# Patient Record
Sex: Female | Born: 1985 | Race: White | Hispanic: No | Marital: Single | Smoking: Current every day smoker
Health system: Southern US, Community
[De-identification: ages and names within clinical notes are randomized; demographics above are authoritative.]

## PROBLEM LIST (undated history)

## (undated) ENCOUNTER — Inpatient Hospital Stay (HOSPITAL_COMMUNITY): Payer: Self-pay

## (undated) DIAGNOSIS — O24419 Gestational diabetes mellitus in pregnancy, unspecified control: Secondary | ICD-10-CM

## (undated) DIAGNOSIS — F329 Major depressive disorder, single episode, unspecified: Secondary | ICD-10-CM

## (undated) DIAGNOSIS — J45909 Unspecified asthma, uncomplicated: Secondary | ICD-10-CM

## (undated) DIAGNOSIS — F32A Depression, unspecified: Secondary | ICD-10-CM

## (undated) DIAGNOSIS — K8020 Calculus of gallbladder without cholecystitis without obstruction: Secondary | ICD-10-CM

## (undated) HISTORY — PX: COMBINED HYSTEROSCOPY DIAGNOSTIC / D&C: SUR297

---
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
## 2006-01-06 ENCOUNTER — Ambulatory Visit: Payer: Self-pay | Admitting: Family Medicine

## 2006-01-09 ENCOUNTER — Ambulatory Visit: Payer: Self-pay | Admitting: Family Medicine

## 2006-06-18 ENCOUNTER — Ambulatory Visit: Payer: Self-pay | Admitting: Family Medicine

## 2006-09-02 ENCOUNTER — Ambulatory Visit: Payer: Self-pay | Admitting: Family Medicine

## 2006-10-22 ENCOUNTER — Ambulatory Visit: Payer: Self-pay | Admitting: Family Medicine

## 2007-01-22 ENCOUNTER — Ambulatory Visit: Payer: Self-pay | Admitting: Family Medicine

## 2007-04-23 IMAGING — NM NM HEPATO W/GB/PHARM/[PERSON_NAME]
2 series · 12 of 12 positions shown · non-contrast
Comparison: none

CLINICAL DATA: Nausea, vomiting.  No gallstones from a CT of 03/05/05.  
 NUCLEAR MEDICINE HEPATOBILIARY SCAN WITH EJECTION FRACTION:
TECHNIQUE: Sequential abdominal images were obtained following intravenous injection of radiopharmaceutical.  Sequential images were continued following oral ingestion of 8 oz. half-and-half, and the gallbladder ejection fraction was calculated.
 Radiopharmaceutical:  5 mCi Tc-IIm Choletec.

[Series 1: hepatobiliary · 3.20mm/px · 6 of 60 frames shown (1 of 2)]
[frame 6/60]
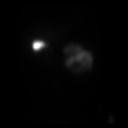
[frame 16/60]
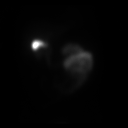
[frame 26/60]
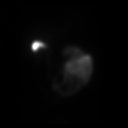
[frame 36/60]
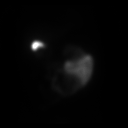
[frame 46/60]
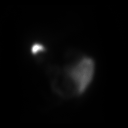
[frame 56/60]
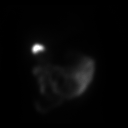

[Series 1: hepatobiliary · 3.20mm/px · 6 of 60 frames shown (2 of 2)]
[frame 6/60]
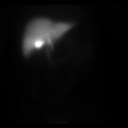
[frame 16/60]
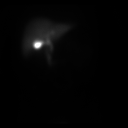
[frame 26/60]
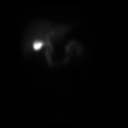
[frame 36/60]
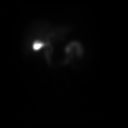
[frame 46/60]
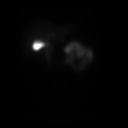
[frame 56/60]
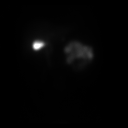

[12 of 12 positions shown; findings below may reference images not displayed]

FINDINGS: There was seen prompt excretion of the Isotope by the normal sized and contoured liver.  The gallbladder was seen earlier in the exam, as were the common bile duct and the bowel.  There was no evidence of obstruction of common bile duct or the cystic duct.
 The patient was then given 8 ounces of Half-n-Half dairy creamer and the ejection fraction was seen to be 33.9% which is slightly below the normal range of 35% at one hour.
IMPRESSION: Hepatobiliary scan is within normal limits.  There was an ejection fraction of 33.9% which is slightly lower than the usual 35% or greater at one hour.

## 2009-09-12 ENCOUNTER — Encounter: Admission: RE | Admit: 2009-09-12 | Discharge: 2009-09-12 | Payer: Self-pay | Admitting: Obstetrics and Gynecology

## 2009-10-20 ENCOUNTER — Ambulatory Visit: Payer: Self-pay | Admitting: Physician Assistant

## 2009-10-20 ENCOUNTER — Inpatient Hospital Stay (HOSPITAL_COMMUNITY): Admission: AD | Admit: 2009-10-20 | Discharge: 2009-10-20 | Payer: Self-pay | Admitting: Obstetrics and Gynecology

## 2009-11-15 ENCOUNTER — Inpatient Hospital Stay (HOSPITAL_COMMUNITY)
Admission: AD | Admit: 2009-11-15 | Discharge: 2009-11-19 | Payer: Self-pay | Source: Ambulatory Visit | Admitting: Obstetrics and Gynecology

## 2010-11-19 LAB — GLUCOSE, CAPILLARY
Glucose-Capillary: 61 mg/dL — ABNORMAL LOW (ref 70–99)
Glucose-Capillary: 66 mg/dL — ABNORMAL LOW (ref 70–99)
Glucose-Capillary: 73 mg/dL (ref 70–99)
Glucose-Capillary: 73 mg/dL (ref 70–99)
Glucose-Capillary: 74 mg/dL (ref 70–99)
Glucose-Capillary: 79 mg/dL (ref 70–99)
Glucose-Capillary: 79 mg/dL (ref 70–99)
Glucose-Capillary: 79 mg/dL (ref 70–99)
Glucose-Capillary: 82 mg/dL (ref 70–99)
Glucose-Capillary: 84 mg/dL (ref 70–99)
Glucose-Capillary: 87 mg/dL (ref 70–99)
Glucose-Capillary: 88 mg/dL (ref 70–99)
Glucose-Capillary: 90 mg/dL (ref 70–99)
Glucose-Capillary: 94 mg/dL (ref 70–99)

## 2010-11-19 LAB — CBC
HCT: 27.7 % — ABNORMAL LOW (ref 36.0–46.0)
HCT: 31.3 % — ABNORMAL LOW (ref 36.0–46.0)
HCT: 37.8 % (ref 36.0–46.0)
Hemoglobin: 10.7 g/dL — ABNORMAL LOW (ref 12.0–15.0)
Platelets: 305 10*3/uL (ref 150–400)
RDW: 16.1 % — ABNORMAL HIGH (ref 11.5–15.5)
WBC: 19.8 10*3/uL — ABNORMAL HIGH (ref 4.0–10.5)
WBC: 30 10*3/uL — ABNORMAL HIGH (ref 4.0–10.5)

## 2010-12-08 ENCOUNTER — Inpatient Hospital Stay (HOSPITAL_COMMUNITY)
Admission: AD | Admit: 2010-12-08 | Discharge: 2010-12-09 | Disposition: A | Discharge status: Home / Self Care | Payer: BC Managed Care – PPO | Source: Ambulatory Visit | Attending: Obstetrics and Gynecology | Admitting: Obstetrics and Gynecology

## 2010-12-08 DIAGNOSIS — O212 Late vomiting of pregnancy: Secondary | ICD-10-CM

## 2010-12-08 LAB — URINE MICROSCOPIC-ADD ON

## 2010-12-08 LAB — URINALYSIS, ROUTINE W REFLEX MICROSCOPIC
Glucose, UA: NEGATIVE mg/dL
Hgb urine dipstick: NEGATIVE
Ketones, ur: 40 mg/dL — AB
Nitrite: NEGATIVE
Urobilinogen, UA: 1 mg/dL (ref 0.0–1.0)
pH: 7.5 (ref 5.0–8.0)

## 2010-12-12 ENCOUNTER — Inpatient Hospital Stay (HOSPITAL_COMMUNITY)
Admission: AD | Admit: 2010-12-12 | Discharge: 2010-12-13 | Disposition: A | Discharge status: Home / Self Care | Payer: BC Managed Care – PPO | Source: Ambulatory Visit | Attending: Obstetrics and Gynecology | Admitting: Obstetrics and Gynecology

## 2010-12-12 DIAGNOSIS — R109 Unspecified abdominal pain: Secondary | ICD-10-CM

## 2010-12-12 DIAGNOSIS — O4700 False labor before 37 completed weeks of gestation, unspecified trimester: Secondary | ICD-10-CM

## 2010-12-31 ENCOUNTER — Other Ambulatory Visit: Payer: Self-pay | Admitting: Obstetrics and Gynecology

## 2010-12-31 ENCOUNTER — Encounter (HOSPITAL_COMMUNITY): Payer: BC Managed Care – PPO

## 2010-12-31 LAB — SURGICAL PCR SCREEN: MRSA, PCR: NEGATIVE

## 2010-12-31 LAB — CBC
HCT: 38.7 % (ref 36.0–46.0)
Hemoglobin: 12.7 g/dL (ref 12.0–15.0)
MCV: 86.4 fL (ref 78.0–100.0)

## 2011-01-07 ENCOUNTER — Inpatient Hospital Stay (HOSPITAL_COMMUNITY)
Admission: RE | Admit: 2011-01-07 | Discharge: 2011-01-10 | DRG: 371 | Disposition: A | Discharge status: Home / Self Care | Payer: BC Managed Care – PPO | Source: Ambulatory Visit | Attending: Obstetrics and Gynecology | Admitting: Obstetrics and Gynecology

## 2011-01-07 DIAGNOSIS — O34219 Maternal care for unspecified type scar from previous cesarean delivery: Principal | ICD-10-CM | POA: Diagnosis present

## 2011-01-07 DIAGNOSIS — Z01812 Encounter for preprocedural laboratory examination: Secondary | ICD-10-CM

## 2011-01-07 DIAGNOSIS — O99892 Other specified diseases and conditions complicating childbirth: Secondary | ICD-10-CM | POA: Diagnosis present

## 2011-01-07 DIAGNOSIS — Z22330 Carrier of Group B streptococcus: Secondary | ICD-10-CM

## 2011-01-07 DIAGNOSIS — Z01818 Encounter for other preprocedural examination: Secondary | ICD-10-CM

## 2011-01-08 LAB — CBC
Hemoglobin: 11.3 g/dL — ABNORMAL LOW (ref 12.0–15.0)
MCH: 27.5 pg (ref 26.0–34.0)
RBC: 4.11 MIL/uL (ref 3.87–5.11)
WBC: 19.5 10*3/uL — ABNORMAL HIGH (ref 4.0–10.5)

## 2011-01-11 NOTE — Discharge Summary (Signed)
NAMEMarland Kitchen  Kari Pearson, Kari Pearson                            ACCOUNT NO.:  [id number]   MEDICAL RECORD NO.:  [id number]                   PATIENT TYPE:  INP   LOCATION:  A315                                 FACILITY:  [facility]   PHYSICIAN:  Lionel December, M.D.                 DATE OF BIRTH:  [birth year]   DATE OF ADMISSION:  05/21/2002  DATE OF DISCHARGE:  05/27/2002                                 DISCHARGE SUMMARY   DISCHARGE DIAGNOSES:  1. 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  2. Mildly elevated transaminases, nonspecific.  Resolved.   CONDITION AT TIME OF DISCHARGE:  Improved.   DISCHARGE MEDICATIONS:  1. Prednisone 40 mg q.a.m. dose reduction as written on discharge sheet.  2. Metronidazole 250 mg t.i.d. for 4 days.  3. Protonix 40 mg p.o. q.a.m.  4. Dilaudid 4 mg p.o. before each meal and at bedtime.  5. Gastrocrom 100 mg t.i.d.  6. Phenergan 12.5 to 25 mg p.o. t.i.d. p.r.n.   FOLLOW UP:  Follow up on June 03, 2002.  The patient will have CBC, AST,  ALT prior to office visit.   HOSPITAL COURSE:  The patient is a 25 year old Caucasian female who  generally has been in good health.  Present illness began 4 days prior to  admission.  She developed upper and midabdominal pain associated with nausea  and vomiting which did not respond to outpatient therapy.  She had been seen  in the emergency room 2 days prior to admission.  Her CBC, MET-7, LFTs,  amylase and lipase were all normal.  Pelvic ultrasound was also within  normal limits.  At that time she had abdominopelvic CT which showed mild  abdominal adenopathy limited to mesenteric lymph nodes.  She had small bowel  thickening which was mainly in the jejunum and duodenum and mild  splenomegaly as well as a very small left ovarian cyst.  The patient was  scheduled to be seen on an outpatient basis but developed intractable pain  which was not responding to Vicodin that she was taking every 4 hours and  she also had persistent  nausea and vomiting.  On admission she weighed 145.5  pounds, she is 5 feet 1 inch tall, she was afebrile.  She was in some pain.  She was begun on IV fluids, IV Pepcid and given Nubain for pain control.  Her lab studies were repeated.  Her WBC was 11.6, H&H were 14 and 41.2,  platelet count was 387K.  She had 8% eosinophils.  Her AST was 49 which was  mildly elevated and ALT was 33, the rest of the LFT panel was normal.  LDH  was normal at 152, sed rate was also normal at 5.  With this picture, I felt  that she had infectious mononucleosis however, Monospot was negative.  She  continued to experience severe pain and nausea, vomiting, and analgesia had  to  be changed.  She was able to undergo upper GI with small-bowel follow-  through the following day.  There was thickening to mucosa of duodenum and  jejunum.  There was no evidence of peptic ulcer disease or changes of  ileitis.  LFTs were repeated, her AST was 47 but ALT almost doubled to 64.  Giardia stool antigen was requested along with celiac antibody panel.  She  underwent esophagogastroduodenoscopy by me on May 18, 2002.  Her  esophagus was normal, she had anterolateral __________ edema and biopsy was  taken.  She had marked edema and irregularity to mucosal folds of second,  third, and possibly the fourth part of the duodenum however, there was no  ulceration or erosions.  Multiple biopsies were taken.  Her CBC was repeated  and her eos count was now 10%.  She was empirically treated with  metronidazole but did not feel any better.  Since her transaminases were  elevated I rechecked Monospot and it was still negative.  Biopsy from the  stomach and duodenum showed changes of eosinophilic duodenitis which were  more pronounced in the small bowel but also seen on the gastric biopsies.  Ultrasound of upper abdomen was obtained and was normal.  She had sludge in  her gallbladder but that was felt to be nonspecific and not unusual  in a  patient who been fasting and not able to eat.  With this information, I felt  that she had eosinophilic enteritis, possibly idiopathic.  She never  experienced diarrhea during her hospitalization.  Her celiac antibody panel  was negative.  Felt that this could also be food allergy which would be  difficult to pinpoint.  She was begun on the [facility] Xxx which was not  immediately available.  Since she was suffering a great deal with abdominal  pain I elected to treat her with IV steroids.  Her eos count had jumped to  15% when the therapy was initiated.  After a few doses her eos count was 0  and then parallel to that she noted improvement in her upper abdominal pain  and was able to keep her food down.  Her last CBC revealed WBC of 18.5 but I  felt that this leukocytosis was a result of steroids.  By the afternoon of  May 27, 2002, she was feeling greatly better and I felt she was ready to  be discharged.  She was given suppository for constipation.  Early on  nursing staff thought that they noted irregular rhythm therefore, EKG was  obtained which showed sinus arrhythmia otherwise normal EKG.   PLAN:  Plan is for her to be seen in the office next week.  We will consider  rapid prednisone taper.  She will have a followup CT within the next 4-8  weeks to make sure that lymphadenopathy is not progressive.                                               Lionel December, M.D.    NR/MEDQ  D:  06/01/2002  T:  06/02/2002  Job:  [id number]   cc:   Delaney Meigs, M.D.  [address]  [place]  [place] [address]  Fax: [phone]

## 2011-01-11 NOTE — H&P (Signed)
NAMEMarland Kitchen  QUINLYN, TEP                            ACCOUNT NO.:  [id number]   MEDICAL RECORD NO.:  [id number]                   PATIENT TYPE:  INP   LOCATION:  A315                                 FACILITY:  [facility]   PHYSICIAN:  Lionel December, M.D.                 DATE OF BIRTH:  03/26/1986   DATE OF ADMISSION:  05/21/2002  DATE OF DISCHARGE:                                HISTORY & PHYSICAL   PRESENTING COMPLAINT:  Intractable abdominal pain of four days duration.  Nausea and vomiting.   HISTORY OF PRESENT ILLNESS:  The patient is a 25 year old Caucasian female  who is admitted for evaluation of abdominal pain, mesenteric adenopathy,  nausea and vomiting.   She was in usual state of health until four days ago when she experienced  pain on the left side of her abdomen.  The pain was mild to begin with but  gradually increased in intensity, described to be sharp, cutting pain, and  she was cold and clammy according to her mother.  She was brought to the  emergency room on May 19, 2002.  She was evaluated by Dr. Margarita Grizzle.  Her CBC, MET-7, LFTs were all normal; amylase and lipase were normal.  Urine hCG was negative.  She had pelvic ultrasound which was within normal  limits.  There was no evidence of free pelvic fluid.  This was followed by  abdominopelvic CT which showed abdominal adenopathy, mainly of mesenteric  lymph nodes.  They were more pronounced in the right lower quadrant  ileocecal region.  The study also showed small bowel thickening, mild  splenomegaly, and there was a small left ovarian cyst.  Dr. Rosalia Hammers discussed  the case with me over the phone.  The patient was doing well and it was  decided to bring her back for small bowel study prior to office visit.  When  she left the emergency room she was very comfortable.  The patient returned  to the radiology department yesterday for small bowel study but she still  had a contrast from prior CT and the study could not be  done.  The patient's  mother called me this morning stating that the patient had been vomiting all  day and the pain was excruciating.  She had been taking Vicodin one q.4h.  and on one occasion she took two but she still did not get relief to the  point she was comfortable.  It was therefore decided to bring her to the  hospital for pain control and further management.  She has lost 5 pounds in  the last three days.  She has not experienced any fever but has had chills.  She denies hematemesis, heartburn, melena, or rectal bleeding.  There is no  history of diarrhea.   REVIEW OF SYSTEMS:  Negative for joint pains or skin rash.  She has had  intermittent  headache but she has had this for years for which she uses  Aleve or ibuprofen on a p.r.n. basis; last dose was four days ago.  There  has been no change in her headache recently.  She denies dyspnea or chest  pain.  She also denies dysuria, hematuria, or vaginal discharge.  Her last  period was about three weeks ago.  She did have a pelvic exam by Dr. Rosalia Hammers  which was normal.  The patient is not sexually active.  Review of the  systems also negative for sore throat or cough. There is no history of tick  bite.  She does come in contact with dogs but there is no documented history  of flea bites, etc.   MEDICATIONS:  She is presently on  1. Vicodin 5/500 q.4h. p.r.n.  2. NuLev t.i.d.   PAST MEDICAL HISTORY:  She has never had any surgeries.  Two years ago she  got sick while she was visiting her grandparents in [place].  She was  evaluated in the emergency room and had a CT and was told to have a ruptured  ovarian cyst.   ALLERGIES:  PENICILLIN which causes skin rash and skin burning.   FAMILY HISTORY:  Noncontributory.  Both parents and two younger brothers are  in good health.   SOCIAL HISTORY:  The patient is in the 10th grade.  She lives with her  grandparents.   PHYSICAL EXAMINATION:  GENERAL:  Pleasant, mildly-obese  Caucasian female who  is in no acute distress.  VITAL SIGNS:  She weighs in at 145.5 pounds, she is 5 feet 1 inch tall.  Pulse 73 per minute, blood pressure 111/77, respirations 20, and temperature  99.  HEENT:  Conjunctivae pink, sclerae nonicteric.  Oropharyngeal mucosa is  normal.  NECK:  Supple.  No lymphadenopathy, no thyromegaly.  LYMPH:  She also does not have axillary or inguinal adenopathy.  CARDIAC:  With regular rhythm, normal S1 and S2.  No murmur or gallop noted.  LUNGS:  Clear to auscultation.  ABDOMEN:  Symmetrical.  Bowel sounds are normal.  Palpation reveals more or  less generalized tenderness that is more pronounced at epigastric area and  right lower quadrant.  There is some guarding in these areas but no rebound.  The spleen cannot be palpated.  Liver edge is soft, below the right costal  margin.  RECTAL:  Deferred.  EXTREMITIES:  She does not have clubbing, peripheral edema, or skin rash.   LABORATORY DATA:  Labs from May 19, 2002:  WBC 8.7, H&H 14.2, and  41.4, platelet count 354, MCV 77.4.  She has 66% neutrophils, 23 lymphs, 4  monos, 6 eos, and 1 baso.  Urinalysis was within normal limits.  Urinary hCG  was negative.  Serum amylase 40, lipase 29, bilirubin 0.5, AP 87, AST 16,  ALT 13, total protein 6.9, and albumin 3.9.  Sodium 137, potassium 4.4,  chloride 104, CO2 28, glucose 107, BUN 4, creatinine 0.6, calcium 9.2.   I have reviewed the patient's films with Dr. Anselmo Pickler.   ASSESSMENT:  The patient is a 25 year old Caucasian female with a four-day  history of abdominal pain which appears to be migratory associated with  nausea, vomiting, and some weight loss who has thickened small bowel,  mesenteric and ileocecal lymphadenopathy, and mild splenomegaly.  She has  not had any skin rash or sore throat.  She does not have peripheral  lymphadenopathy.  She also does not have diarrhea.  I hope we are dealing with a viral illness, which may explain  this picture.  She could also have  an enteric infection, although she has not had any diarrhea.  Infectious  mononucleosis remains a possibility.  I hope we are not dealing with  lymphoproliferative disorder.    PLAN:  Give her IV fluids and IV analgesia.  Will start her on Pepcid 20 mg  IV q.12h.  CBC with peripheral smear will be repeated, along with sed rate,  mono spot,and LFTs.  Will try to obtain upper GI with small bowel follow  through.  I will also continue her Levsin.                                                Lionel December, M.D.    NR/MEDQ  D:  05/21/2002  T:  05/22/2002  Job:  [id number]   cc:   Delaney Meigs, M.D.  [address]  [place]  [place] [address]  Fax: [phone]

## 2011-01-11 NOTE — Op Note (Signed)
NAMEMarland Kitchen  Kari Pearson, Kari Pearson                            ACCOUNT NO.:  [id number]   MEDICAL RECORD NO.:  [id number]                   PATIENT TYPE:  INP   LOCATION:  A315                                 FACILITY:  [facility]   PHYSICIAN:  Lionel December, M.D.                 DATE OF BIRTH:  09-29-1985   DATE OF PROCEDURE:  05/24/2002  DATE OF DISCHARGE:                                 OPERATIVE REPORT   PROCEDURE:  Esophagogastroduodenoscopy with biopsy.   ENDOSCOPIST:  Lionel December, M.D.   INDICATIONS:  This patient is a 25 year old Caucasian female with new onset  of abdominal pain, nausea and vomiting who has thickened duodenum and  jejunum on CT as well as small-bowel studies.  She also had mesenteric and  ileocolic adenopathy and mild splenomegaly.  Her Monospot test is negative.  She has not experienced any diarrhea.  She also has developed eosinophilia.  Stool Giardia and antigen is also pending.  She is undergoing EGD with  biopsy from the duodenum looking for celiac disease, ripples, etc.   The procedure and risks were reviewed with the patient and her mother, and  informed consent was obtained from her mother.   PREOPERATIVE MEDICATIONS:  Cetacaine spray for pharyngeal topical  anesthesia, Demerol 25 mg IV and Versed 5 mg IV in divided dose.   INSTRUMENT:  Olympus video system.   FINDINGS:  Procedure performed in endoscopy suite.  The patient's vital  signs and O2 saturation were monitored during the procedure and remained  stable.  The patient was placed in the left lateral recumbent position and  endoscope was passed via the oropharynx without any difficulty into the  esophagus.   ESOPHAGUS:  Mucosa of the esophagus was normal.  Squamocolumnar junction was  unremarkable.   STOMACH:  It was empty and distended very well with insufflation.  The folds  of the proximal stomach were normal.  Examination of the mucosa revealed  patchy erythema and some edema, but no erosion or  ulcers were noted.  These  changes are primarily at the antrum.  Biopsy was taken for routine  histology.  Pyloric channel was patent.  Angularis and fundus were examined  by retroflexing the scope and were normal.   DUODENUM:  Examination of the bulb revealed normal mucosa.  The scope was  passed in the second, third, and possibly the fourth part of the duodenum.  Folds were prominent in the third and fourth part an there was mucosal  granularity and edema.  Pictures taken for the record followed by biopsies  from the distal most segment of the duodenum.   Endoscope was withdrawn.  The patient tolerated the procedure well.   FINAL DIAGNOSIS:  Nonspecific findings of antral gastritis and duodenitis  involving the second, third, and possibly fourth part of the duodenum.  As  noted above, biopsies taken.   PLAN:  1. She  will have upper abdominal ultrasound as planned.  2. Will also follow up on pending lab data.                                               Lionel December, M.D.    NR/MEDQ  D:  05/24/2002  T:  05/24/2002  Job:  [id number]

## 2011-01-22 ENCOUNTER — Inpatient Hospital Stay (HOSPITAL_COMMUNITY)
Admission: AD | Admit: 2011-01-22 | Discharge: 2011-01-22 | Disposition: A | Discharge status: Home / Self Care | Payer: BC Managed Care – PPO | Source: Ambulatory Visit | Attending: Obstetrics and Gynecology | Admitting: Obstetrics and Gynecology

## 2011-01-22 DIAGNOSIS — O909 Complication of the puerperium, unspecified: Secondary | ICD-10-CM | POA: Insufficient documentation

## 2011-01-24 NOTE — Discharge Summary (Signed)
Kari Pearson, DOWNS                  ACCOUNT NO.:  [id number]  MEDICAL RECORD NO.:  [id number]           PATIENT TYPE:  I  LOCATION:  9126                          FACILITY:  [facility]  PHYSICIAN:  Guy Sandifer. Henderson Cloud, M.D. DATE OF BIRTH:  [birth year]  DATE OF ADMISSION:  01/07/2011 DATE OF DISCHARGE:  01/10/2011                              DISCHARGE SUMMARY   ADMITTING DIAGNOSES: 1. Intrauterine pregnancy at 39-1/2 weeks' estimated gestational age. 2. Previous cesarean section, desires repeat.  DISCHARGE DIAGNOSES: 1. Status post low transverse cesarean section. 2. Viable female infant.  PROCEDURE:  Repeat low transverse cesarean section.  REASON FOR ADMISSION:  Please see dictated H&P.  HOSPITAL COURSE:  The patient is a 25 year old gravida 2, para 1 that was admitted to [facility] at 39-1/2 weeks' estimated gestational age for scheduled cesarean section.  The patient had had a previous cesarean section, desired repeat.  On the morning of admission, the patient was taken to operating room where spinal anesthesia was administered without difficulty.  A low transverse incision was made with delivery of a viable female infant weighing 7 pounds 5 ounces with Apgar's of 8 at 1 minute and 9 at 5 minutes.  The patient tolerated the procedure well and was taken to the recovery room in stable condition. On postoperative day #1, the patient did complain of some soreness in the left margin of the incision.  Vital signs were stable.  Abdomen soft.  Fundus firm and nontender.  Abdominal dressing was noted to be clean, dry, and intact.  Foley had been discontinued.  She is voiding well.  Laboratory findings showed hemoglobin of 11.3 and blood type is known to be O+.  On postoperative day #2, the patient complained of some soreness.  Vital signs were stable.  Abdomen soft.  Fundus firm and nontender.  Incision was clean, dry, and intact.  Staples were intact. Small amount of  ecchymosis was noted inferior to the incisional site. She is ambulating well.  On postoperative day #3, the patient complained of some nausea after taking Percocet.  Vital signs were stable.  She was afebrile.  Abdomen was soft.  Fundus firm and nontender.  Incision was clean, dry, and intact.  Staples removed and the patient was later discharged home.  CONDITION ON DISCHARGE:  Stable.  DIET:  Regular as tolerated.  ACTIVITY:  No heavy lifting, no driving x2 weeks, no vaginal entry.  FOLLOWUP:  The patient should follow up in the office in 1-2 weeks for an incision check.  She is to call for temperature greater than 100 degrees, persistent nausea, vomiting, heavy vaginal bleeding, and/or redness or drainage from incisional site.  DISCHARGE MEDICATIONS: 1. Tylox #30, 1 p.o. every 4-6 hours p.r.n. 2. Motrin 600 mg every 6 hours. 3. Prenatal vitamins 1 p.o. daily. 4. Colace 1 p.o. daily p.r.n.     Julio Sicks, N.P.   ______________________________ Guy Sandifer. Henderson Cloud, M.D.    CC/MEDQ  D:  01/10/2011  T:  01/10/2011  Job:  [id number]  Electronically Signed by Julio Sicks N.P. on 01/11/2011 09:30:26 AM Electronically Signed by Fayrene Fearing  Lequan Dobratz M.D. on 01/24/2011 01:13:31 PM

## 2011-01-28 NOTE — H&P (Signed)
  NAMEARENA, LINDAHL                  ACCOUNT NO.:  [id number]  MEDICAL RECORD NO.:  [id number]           PATIENT TYPE:  I  LOCATION:  9126                          FACILITY:  [facility]  PHYSICIAN:  Dineen Kid. Rana Snare, M.D.    DATE OF BIRTH:  1986-01-22  DATE OF ADMISSION:  01/07/2011 DATE OF DISCHARGE:                             HISTORY & PHYSICAL   HISTORY OF PRESENT ILLNESS:  Ms. Branagan is a 25 year old G2, P1 at 39-1/[redacted] weeks gestational age who presents for repeat cesarean section.  Her pregnancy has been uncomplicated other than history of abnormal Pap smear.  She had previous cesarean section and she desires repeat.  Her estimated date of confinement Jan 12, 2011, by early ultrasound.  Group B strep was positive.  PAST MEDICAL HISTORY:  Negative.  PAST SURGICAL HISTORY:  Previous cesarean section for failure to progress.  MEDICATIONS:  Prenatal vitamins.  ALLERGIES:  She has an allergy to PENICILLIN but cannot take cephalosporins.  PHYSICAL EXAM:  VITAL SIGNS:  Blood pressure was 110/69. HEART:  Regular rate and rhythm. LUNGS:  Clear to auscultation bilaterally. ABDOMEN:  Gravid, nontender. PELVIC:  Cervix is closed, thick, high.  IMPRESSION AND PLAN:  Intrauterine pregnancy at 39-1/[redacted] weeks gestational age, previous cesarean section, desire repeat.  PLAN:  Repeat low segment transverse cesarean section.  Risks and benefits were discussed at length and informed consent was obtained.     Dineen Kid Rana Snare, M.D.     DCL/MEDQ  D:  01/07/2011  T:  01/08/2011  Job:  [id number]  Electronically Signed by Candice Camp M.D. on 01/28/2011 10:44:56 AM

## 2011-01-28 NOTE — Op Note (Signed)
  NAMEHAWRAA, Kari Pearson                  ACCOUNT NO.:  [id number]  MEDICAL RECORD NO.:  [id number]           PATIENT TYPE:  I  LOCATION:  9126                          FACILITY:  [facility]  PHYSICIAN:  Dineen Kid. Rana Snare, M.D.    DATE OF BIRTH:  1985-11-10  DATE OF PROCEDURE:  01/07/2011 DATE OF DISCHARGE:                              OPERATIVE REPORT   PREOPERATIVE DIAGNOSIS:  Intrauterine pregnancy at 39 weeks, previous cesarean section, desiring a repeat.  POSTOPERATIVE DIAGNOSIS:  Intrauterine pregnancy at 39 weeks, previous cesarean section, desiring a repeat.  PROCEDURE:  Repeat low segment transverse cesarean section.  SURGEON:  Dineen Kid. Rana Snare, M.D.  ANESTHESIA:  Spinal.  INDICATIONS:  [name] is a 25 year old G2, P1, previous pregnancy complicated by cesarean section.  She desires repeat cesarean section. Pregnancy was uncomplicated.  Her estimated date of confinement is Jan 12, 2011.  She desires repeat cesarean section, presents for that. Risks and benefits were discussed.  Informed consent was obtained.  FINDINGS AT THE TIME OF SURGERY:  Viable female infant, Apgars were 8/9, pH arterial 7.32, weight is 7 pounds 5 ounces.  DESCRIPTION OF PROCEDURE:  After adequate analgesia, the patient placed in the supine position with left lateral tilt.  She is sterilely prepped and draped.  Bladder sterilely drained with a Foley catheter. Pfannenstiel skin incision was made 2 fingerbreadths above the pubic symphysis, taken down sharply to the fascia which was incised transversely, extended superiorly and inferiorly to the bellies of rectus muscle which separated sharply in the midline.  Peritoneum was entered sharply.  Bladder flap created and placed behind the bladder blade.  A low segment myotomy incision made down to the amniotic sac, extended laterally with operator's fingertips, the fetal vertex was delivered atraumatically with an easy pull of a vacuum extractor.  Nares and pharynx  were then suctioned.  Infant delivered, cord clamped and cut and handed pediatricians for resuscitation.  Cord blood was then obtained.  Placenta extracted manually.  Uterus was exteriorized, wiped and cleaned with dry lap.  The myotomy incision closed in two layers, first being with running locking layer, second being with imbricating layer of zero Monocryl suture.  The uterus placed back in the abdominal cavity and after copious amount of irrigation, adequate hemostasis was assured.  The peritoneum was then closed with zero Monocryl suture. Rectus muscle plicated in midline.  Irrigation applied and after adequate hemostasis, the fascia was then closed with zero PDS in a running fashion.  Irrigation applied and after adequate hemostasis, skin stapled, Steri-Strips applied.  The patient tolerated the procedure well, was stable and transferred to recovery room.  Sponge count was normal x3.  Estimated blood loss was 700 mL.  The patient received 1 g of cefotetan preoperatively.     Dineen Kid Rana Snare, M.D.     DCL/MEDQ  D:  01/07/2011  T:  01/07/2011  Job:  [id number]  Electronically Signed by Candice Camp M.D. on 01/28/2011 10:44:54 AM

## 2011-09-19 IMAGING — US US FETAL BPP W/O NONSTRESS
1 series · 14 of 20 positions shown · non-contrast
Comparison: none

OBSTETRICAL ULTRASOUND:
 This ultrasound exam was performed in the [HOSPITAL] Ultrasound Department.  The OB US report was generated in the AS system, and faxed to the ordering physician.  This report is also available in [HOSPITAL]?s Access[employer] and in [REDACTED] PACS.

[Series 1: us fetal bpp w/o nonstress · non-contrast · 0.27mm/px · 20 acquisitions, 14 frames shown]
[im 1/20]
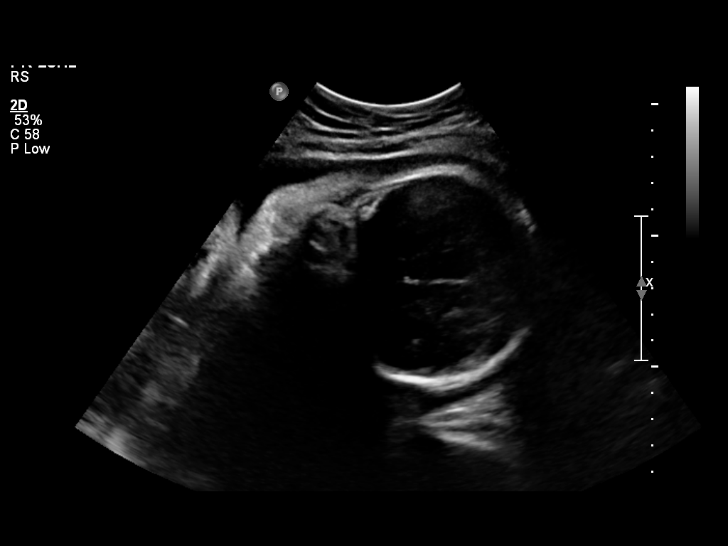
[im 3/20]
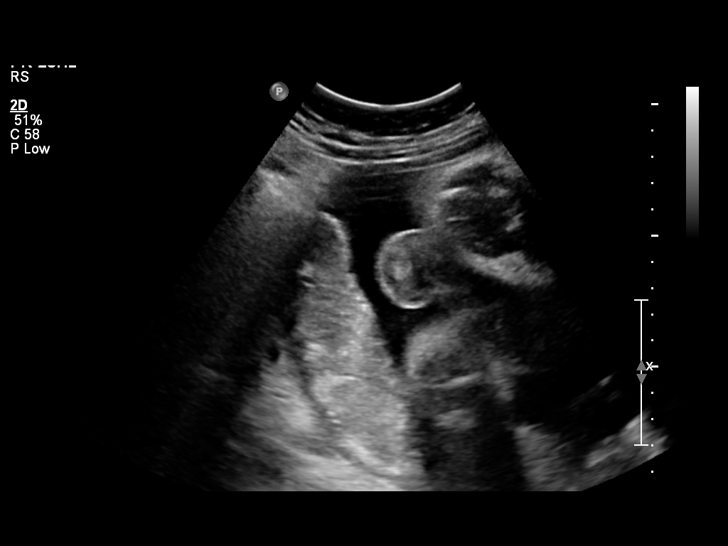
[im 4/20]
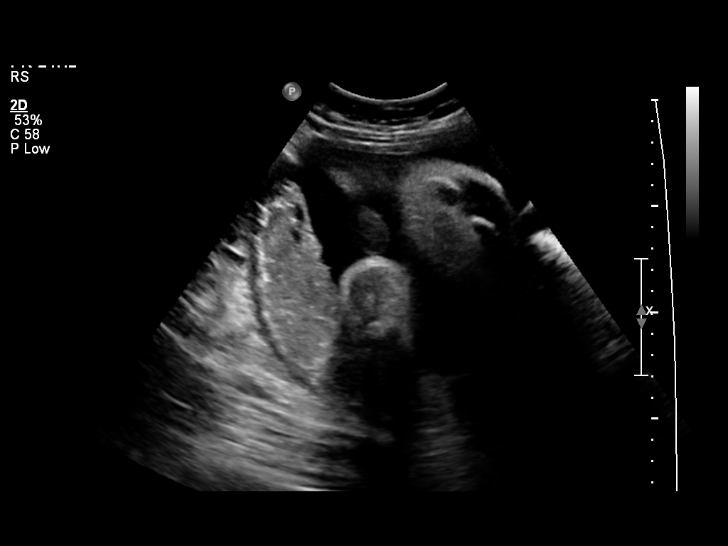
[im 6/20]
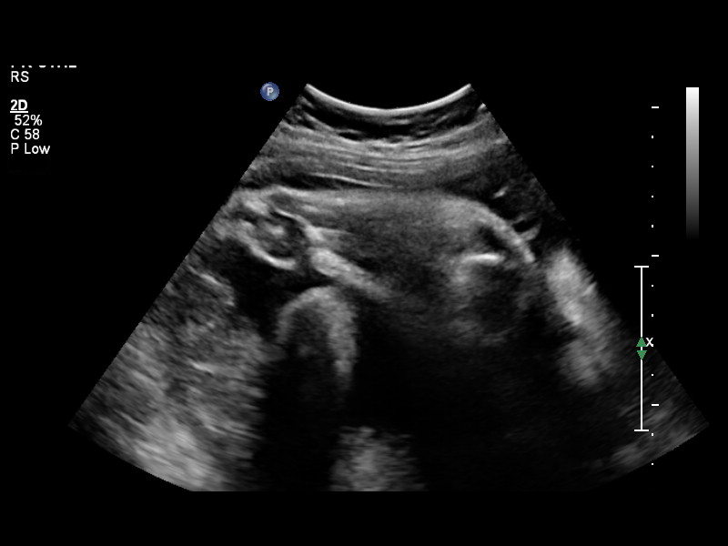
[im 7/20]
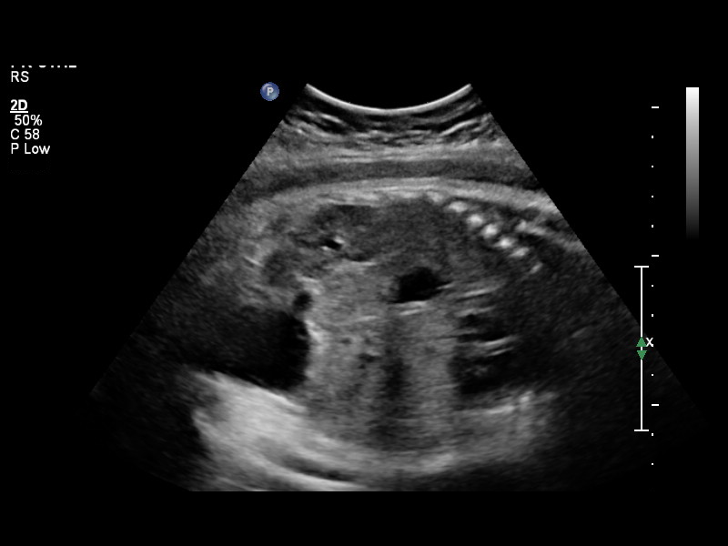
[im 8/20]
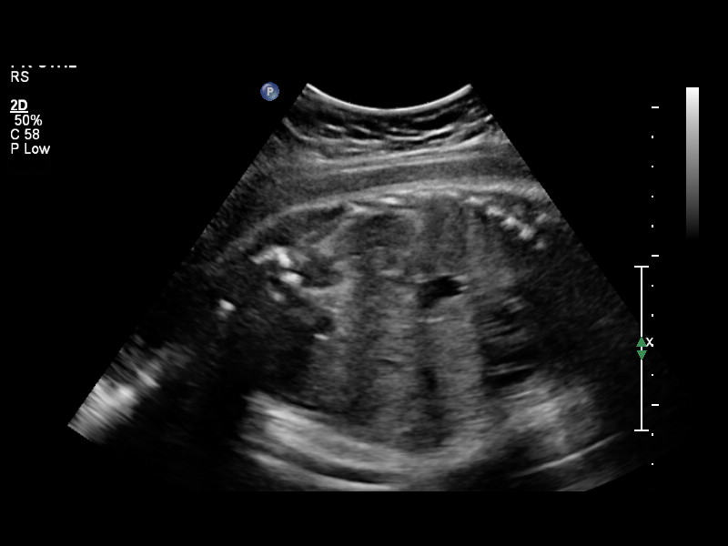
[im 10/20]
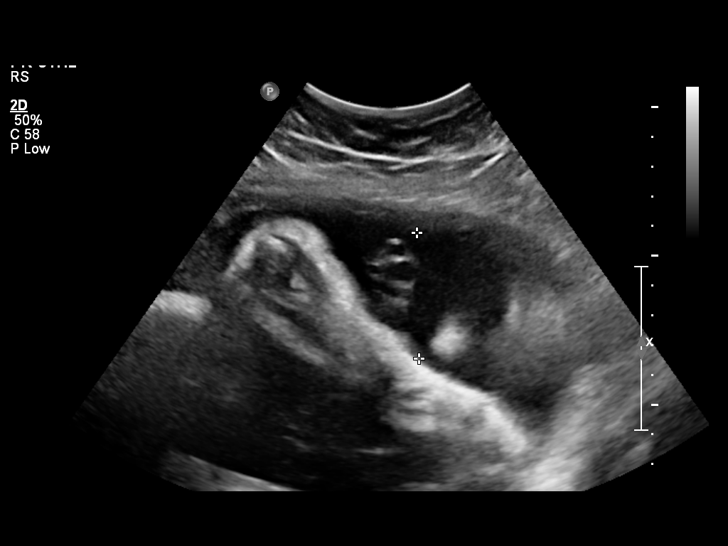
[im 11/20]
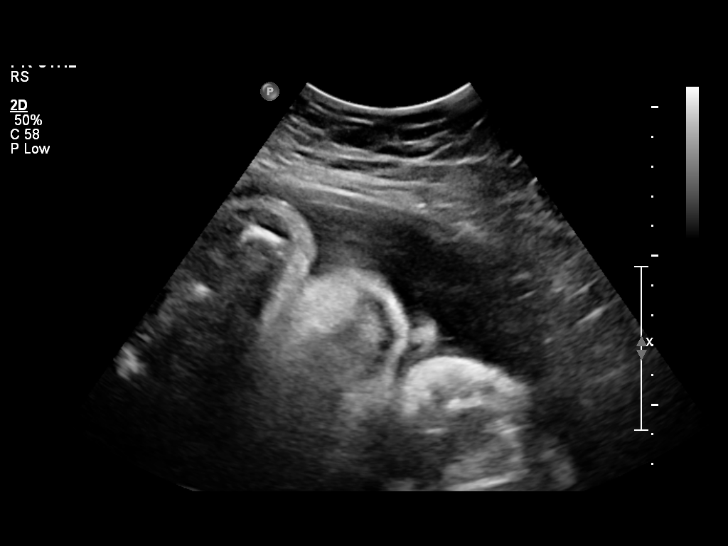
[im 13/20]
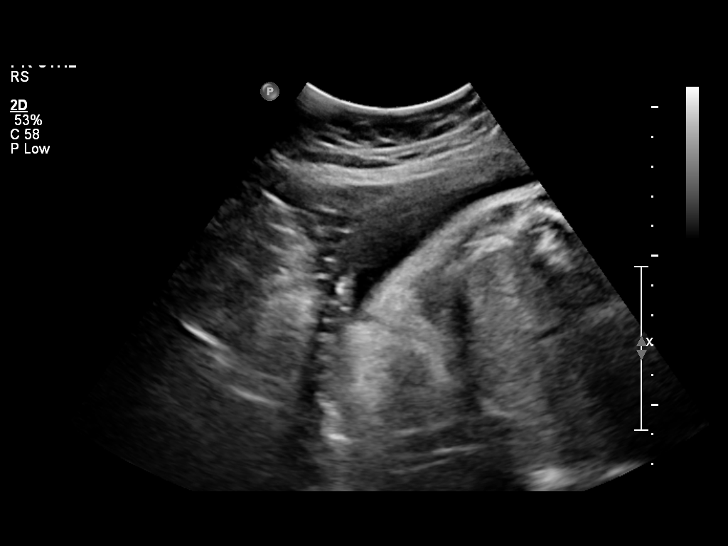
[im 14/20]
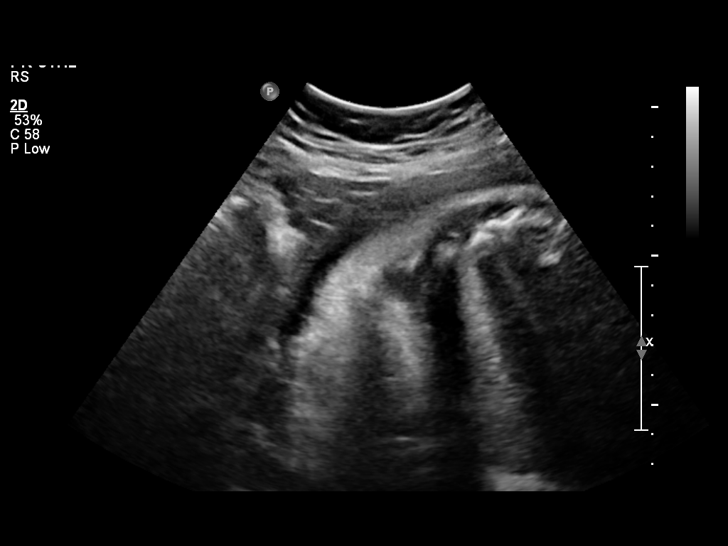
[im 16/20]
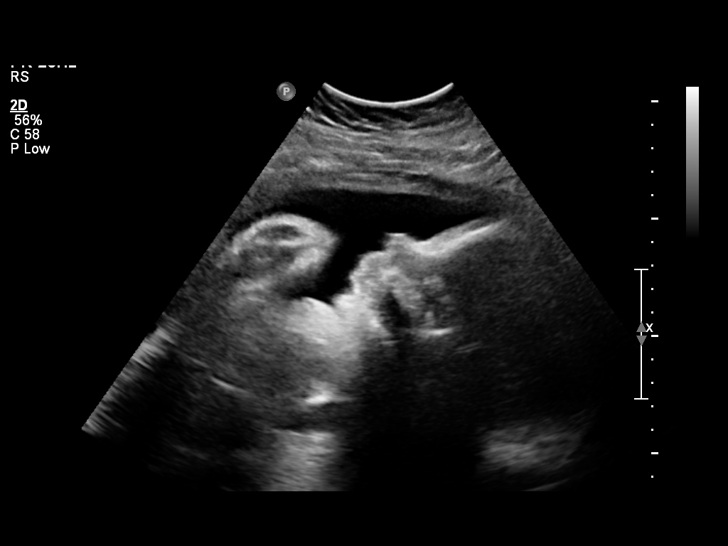
[im 17/20]
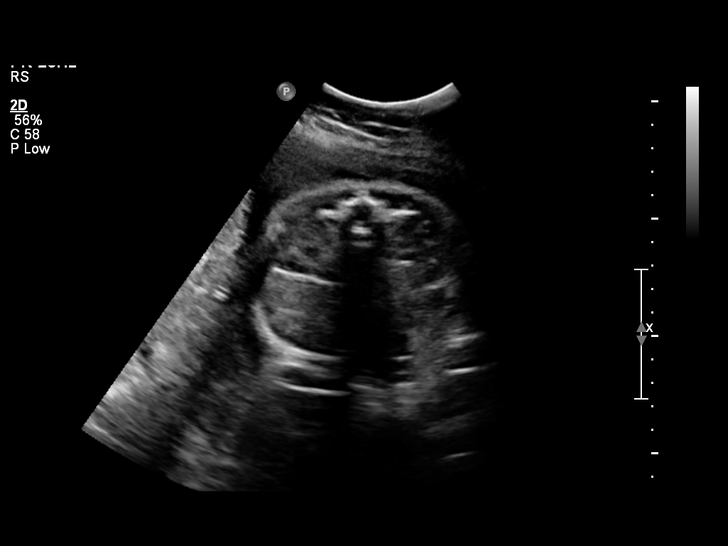
[im 18/20]
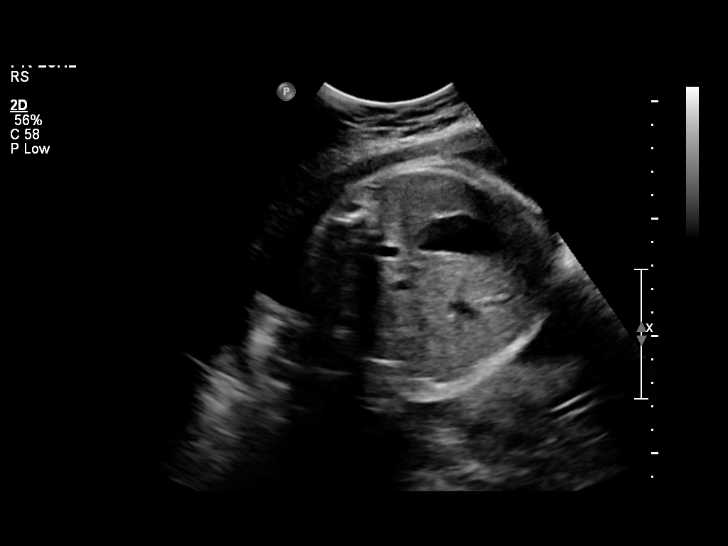
[im 20/20]
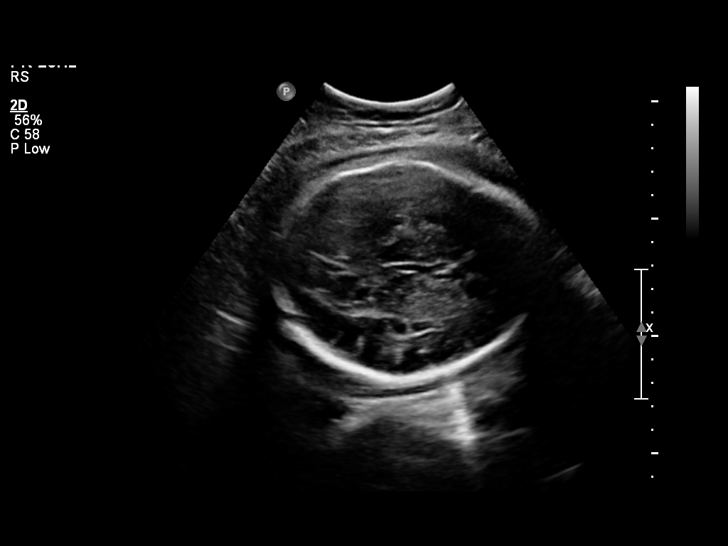

[14 of 20 positions shown; findings below may reference images not displayed]

IMPRESSION: See [facility] Obstetric US report.

## 2012-01-28 ENCOUNTER — Encounter: Payer: Medicaid Other | Admitting: Obstetrics and Gynecology

## 2015-11-22 ENCOUNTER — Ambulatory Visit: Payer: Self-pay | Admitting: Family Medicine

## 2015-11-23 ENCOUNTER — Encounter: Payer: Self-pay | Admitting: Family Medicine

## 2016-01-05 ENCOUNTER — Institutional Professional Consult (permissible substitution): Payer: Medicaid Other | Admitting: Internal Medicine

## 2016-05-26 HISTORY — PX: LAPAROSCOPY: SHX197

## 2017-01-16 LAB — OB RESULTS CONSOLE RUBELLA ANTIBODY, IGM: Rubella: IMMUNE

## 2017-01-16 LAB — OB RESULTS CONSOLE GC/CHLAMYDIA
Chlamydia: NEGATIVE
Gonorrhea: NEGATIVE

## 2017-01-16 LAB — OB RESULTS CONSOLE ANTIBODY SCREEN: Antibody Screen: NEGATIVE

## 2017-01-16 LAB — OB RESULTS CONSOLE ABO/RH: RH TYPE: POSITIVE

## 2017-01-16 LAB — OB RESULTS CONSOLE HEPATITIS B SURFACE ANTIGEN: Hepatitis B Surface Ag: NEGATIVE

## 2017-01-16 LAB — OB RESULTS CONSOLE HIV ANTIBODY (ROUTINE TESTING): HIV: NONREACTIVE

## 2017-01-16 LAB — OB RESULTS CONSOLE RPR: RPR: NONREACTIVE

## 2017-01-16 LAB — OB RESULTS CONSOLE GBS: STREP GROUP B AG: POSITIVE

## 2017-03-13 ENCOUNTER — Encounter (HOSPITAL_COMMUNITY): Payer: Self-pay | Admitting: *Deleted

## 2017-03-13 ENCOUNTER — Inpatient Hospital Stay (HOSPITAL_COMMUNITY)
Admission: AD | Admit: 2017-03-13 | Discharge: 2017-03-13 | Disposition: A | Discharge status: Home / Self Care | Payer: Medicaid Other | Source: Ambulatory Visit | Attending: Obstetrics & Gynecology | Admitting: Obstetrics & Gynecology

## 2017-03-13 DIAGNOSIS — Z3A16 16 weeks gestation of pregnancy: Secondary | ICD-10-CM | POA: Diagnosis not present

## 2017-03-13 DIAGNOSIS — O26892 Other specified pregnancy related conditions, second trimester: Secondary | ICD-10-CM | POA: Diagnosis not present

## 2017-03-13 DIAGNOSIS — G43909 Migraine, unspecified, not intractable, without status migrainosus: Secondary | ICD-10-CM | POA: Insufficient documentation

## 2017-03-13 DIAGNOSIS — O9989 Other specified diseases and conditions complicating pregnancy, childbirth and the puerperium: Secondary | ICD-10-CM | POA: Diagnosis not present

## 2017-03-13 DIAGNOSIS — R51 Headache: Secondary | ICD-10-CM | POA: Diagnosis present

## 2017-03-13 LAB — URINALYSIS, ROUTINE W REFLEX MICROSCOPIC
BILIRUBIN URINE: NEGATIVE
Glucose, UA: NEGATIVE mg/dL
HGB URINE DIPSTICK: NEGATIVE
KETONES UR: NEGATIVE mg/dL
NITRITE: NEGATIVE
PROTEIN: NEGATIVE mg/dL
Specific Gravity, Urine: 1.01 (ref 1.005–1.030)
pH: 6 (ref 5.0–8.0)

## 2017-03-13 MED ORDER — DEXAMETHASONE SODIUM PHOSPHATE 10 MG/ML IJ SOLN
10.0000 mg | Freq: Once | INTRAMUSCULAR | Status: AC
  Administered 2017-03-13: 10 mg via INTRAVENOUS
  Filled 2017-03-13: qty 1

## 2017-03-13 MED ORDER — DIPHENHYDRAMINE HCL 50 MG/ML IJ SOLN: 25.0000 mg | Freq: Once | INTRAMUSCULAR | Status: DC

## 2017-03-13 MED ORDER — METOCLOPRAMIDE HCL 5 MG/ML IJ SOLN
10.0000 mg | Freq: Once | INTRAMUSCULAR | Status: AC
  Administered 2017-03-13: 10 mg via INTRAVENOUS
  Filled 2017-03-13: qty 2

## 2017-03-13 MED ORDER — LACTATED RINGERS IV SOLN
INTRAVENOUS | Status: DC
  Administered 2017-03-13: 16:00:00 via INTRAVENOUS

## 2017-03-13 NOTE — MAU Note (Signed)
Pt reports she has had a real bad headache for 3 days. Taking fiorect for it and has not worked. OB sent her here for headache cocktail.

## 2017-03-13 NOTE — MAU Provider Note (Signed)
Chief Complaint:  Headache   First Provider Initiated Contact with Patient 03/13/17 1458     HPI: Kari Pearson is a 31 y.o. G1P0 at 79w4dwho presents to maternity admissions reporting headache, migraine type.   Has not gotten relief from fioricet.  Gets better but then headache rebounds. . She reports good fetal movement, denies LOF, vaginal bleeding, vaginal itching/burning, urinary symptoms,  dizziness, n/v, diarrhea, constipation or fever/chills.  .  Headache   This is a recurrent problem. The current episode started today. The problem occurs constantly. The problem has been unchanged. The pain is located in the frontal and bilateral region. The pain does not radiate. The pain quality is similar to prior headaches. The quality of the pain is described as aching and dull. The pain is moderate. Associated symptoms include photophobia. Pertinent negatives include no abdominal pain, back pain, blurred vision, dizziness, fever, seizures, sinus pressure, tingling, visual change or vomiting. The symptoms are aggravated by bright light. Treatments tried: Fioricet. The treatment provided mild relief.    RN Note: Pt reports she has had a real bad headache for 3 days. Taking fiorect for it and has not worked. OB sent her here for headache cocktail.  Past Medical History: No past medical history on file.  Past obstetric history: OB History  Gravida Para Term Preterm AB Living  1            SAB TAB Ectopic Multiple Live Births               # Outcome Date GA Lbr Len/2nd Weight Sex Delivery Anes PTL Lv  1 Current               Past Surgical History: No past surgical history on file.  Family History: No family history on file.  Social History: Social History  Substance Use Topics  . Smoking status: Not on file  . Smokeless tobacco: Not on file  . Alcohol use Not on file    Allergies: Allergies not on file  Meds:  No prescriptions prior to admission.    I have reviewed patient's  Past Medical Hx, Surgical Hx, Family Hx, Social Hx, medications and allergies.   ROS:  Review of Systems  Constitutional: Negative for fever.  HENT: Negative for sinus pressure.   Eyes: Positive for photophobia. Negative for blurred vision.  Gastrointestinal: Negative for abdominal pain and vomiting.  Musculoskeletal: Negative for back pain.  Neurological: Positive for headaches. Negative for dizziness, tingling and seizures.   Other systems negative  Physical Exam  Patient Vitals for the past 24 hrs:  BP Temp Pulse Resp Height Weight  03/13/17 1453 (!) 115/58 98.3 F (36.8 C) (!) 104 18 5' (1.524 m) 159 lb (72.1 kg)   Constitutional: Well-developed, well-nourished female in no acute distress.  Cardiovascular: normal rate and rhythm Respiratory: normal effort, clear to auscultation bilaterally GI: Abd soft, non-tender, gravid appropriate for gestational age.   No rebound or guarding. MS: Extremities nontender, no edema, normal ROM Neurologic: Alert and oriented x 4.  GU: Neg CVAT.  PELVIC EXAM: deferred   Labs:    Results for orders placed or performed during the hospital encounter of 03/13/17 (from the past 24 hour(s))  Urinalysis, Routine w reflex microscopic     Status: Abnormal   Collection Time: 03/13/17  3:15 PM  Result Value Ref Range   Color, Urine YELLOW YELLOW   APPearance CLOUDY (A) CLEAR   Specific Gravity, Urine 1.010 1.005 - 1.030  pH 6.0 5.0 - 8.0   Glucose, UA NEGATIVE NEGATIVE mg/dL   Hgb urine dipstick NEGATIVE NEGATIVE   Bilirubin Urine NEGATIVE NEGATIVE   Ketones, ur NEGATIVE NEGATIVE mg/dL   Protein, ur NEGATIVE NEGATIVE mg/dL   Nitrite NEGATIVE NEGATIVE   Leukocytes, UA LARGE (A) NEGATIVE   RBC / HPF 0-5 0 - 5 RBC/hpf   WBC, UA 6-30 0 - 5 WBC/hpf   Bacteria, UA RARE (A) NONE SEEN   Squamous Epithelial / LPF TOO NUMEROUS TO COUNT (A) NONE SEEN   Mucous PRESENT      Imaging:  No results found.  MAU Course/MDM: I have ordered labs and  reviewed results.  Consult Dr Langston MaskerMorris with presentation, exam findings and test results.  Treatments in MAU included IV fluids, Reglan and Decadron.  Did not want Benadryl as she is driving. Has Phenergan at home.   Did get almost total relief of headache. Still has mild one.  Encouraged to take Phenergan at home as it will likely enhance effect of cocktail..    Assessment: SIUP at 1856w4d Migraine headache  Plan: Discharge home Phenergan at home Follow up in Office for prenatal visits and recheck of status  Encouraged to return here or to other Urgent Care/ED if she develops worsening of symptoms, increase in pain, fever, or other concerning symptoms.   Pt stable at time of discharge.  Wynelle BourgeoisMarie Kasiyah Platter CNM, MSN Certified Nurse-Midwife 03/13/2017 2:58 PM

## 2017-03-13 NOTE — MAU Note (Addendum)
Pt. Complain of "bee stinging" in vaginal area, running up her back after administration of Decadron. [facility] "[facility]" lasted for  est. 10 sec.   Provider notified.  [facility]

## 2017-03-13 NOTE — Discharge Instructions (Signed)
Recurrent Migraine Headache °A migraine headache is very bad, throbbing pain that is usually on one side of your head. Recurrent migraines keep coming back (recurring). Talk with your doctor about what things may bring on (trigger) your migraine headaches. °Follow these instructions at home: °Medicines  °· Take over-the-counter and prescription medicines only as told by your doctor. °· Do not drive or use heavy machinery while taking prescription pain medicine. °Lifestyle  °· Do not use any products that contain nicotine or tobacco, such as cigarettes and e-cigarettes. If you need help quitting, ask your doctor. °· Limit alcohol intake to no more than 1 drink a day for nonpregnant women and 2 drinks a day for men. One drink equals 12 oz of beer, 5 oz of wine, or 1½ oz of hard liquor. °· Get 7-9 hours of sleep each night. °· Lessen any stress in your life. Ask your doctor about ways to lower your stress. °· Stay at a healthy weight. Talk with your doctor if you need help losing weight. °· Get regular exercise. °General instructions  °· Keep a journal to find out if certain things bring on migraine headaches. For example, write down: °¨ What you eat and drink. °¨ How much sleep you get. °¨ Any change to your diet or medicines. °· Lie down in a dark, quiet room when you have a migraine. °· Try placing a cool towel over your head when you have a migraine. °· Keep lights dim if bright lights bother you or make your migraines worse. °· Keep all follow-up visits as told by your doctor. This is important. °Contact a doctor if: °· Medicine does not help your migraines. °· Your pain keeps coming back. °· You have a fever. °· You have weight loss without trying. °Get help right away if: °· Your migraine becomes really bad and medicine does not help. °· You have a stiff neck. °· You have trouble seeing. °· Your muscles are weak or you lose control of your muscles. °· You lose your balance or have trouble walking. °· You feel  like you will pass out (faint) or you pass out. °· You have really bad symptoms that are different than your first symptoms. °· You start having sudden, very bad headaches that last for one second or less, like a thunderclap. °Summary °· A migraine headache is very bad, throbbing pain that is usually on one side of your head. °· Talk with your doctor about what things may bring on (trigger) your migraine headaches. °· Take over-the-counter and prescription medicines only as told by your doctor. °· Lie down in a dark, quiet room when you have a migraine. °· Keep a journal about what you eat and drink, how much sleep you get, and any changes to your medicines. This can help you find out if certain things make you have migraine headaches. °This information is not intended to replace advice given to you by your health care provider. Make sure you discuss any questions you have with your health care provider. °Document Released: 05/21/2008 Document Revised: 07/05/2016 Document Reviewed: 07/05/2016 °Elsevier Interactive Patient Education © 2017 [employer] ° °

## 2017-04-28 ENCOUNTER — Observation Stay (HOSPITAL_COMMUNITY)
Admission: AD | Admit: 2017-04-28 | Discharge: 2017-04-29 | Disposition: A | Discharge status: Home / Self Care | Payer: Medicaid Other | Source: Ambulatory Visit | Attending: Obstetrics and Gynecology | Admitting: Obstetrics and Gynecology

## 2017-04-28 ENCOUNTER — Inpatient Hospital Stay (HOSPITAL_COMMUNITY): Payer: Medicaid Other

## 2017-04-28 ENCOUNTER — Encounter (HOSPITAL_COMMUNITY): Payer: Self-pay | Admitting: *Deleted

## 2017-04-28 DIAGNOSIS — F17210 Nicotine dependence, cigarettes, uncomplicated: Secondary | ICD-10-CM | POA: Insufficient documentation

## 2017-04-28 DIAGNOSIS — Z79899 Other long term (current) drug therapy: Secondary | ICD-10-CM | POA: Insufficient documentation

## 2017-04-28 DIAGNOSIS — J45909 Unspecified asthma, uncomplicated: Secondary | ICD-10-CM | POA: Diagnosis not present

## 2017-04-28 DIAGNOSIS — Z3A23 23 weeks gestation of pregnancy: Secondary | ICD-10-CM | POA: Diagnosis not present

## 2017-04-28 DIAGNOSIS — O219 Vomiting of pregnancy, unspecified: Secondary | ICD-10-CM | POA: Insufficient documentation

## 2017-04-28 DIAGNOSIS — R1013 Epigastric pain: Secondary | ICD-10-CM | POA: Diagnosis present

## 2017-04-28 DIAGNOSIS — O9989 Other specified diseases and conditions complicating pregnancy, childbirth and the puerperium: Secondary | ICD-10-CM | POA: Insufficient documentation

## 2017-04-28 DIAGNOSIS — R1010 Upper abdominal pain, unspecified: Secondary | ICD-10-CM

## 2017-04-28 HISTORY — DX: Unspecified asthma, uncomplicated: J45.909

## 2017-04-28 LAB — COMPREHENSIVE METABOLIC PANEL
ALK PHOS: 113 U/L (ref 38–126)
ALT: 15 U/L (ref 14–54)
ANION GAP: 12 (ref 5–15)
AST: 30 U/L (ref 15–41)
Albumin: 3.6 g/dL (ref 3.5–5.0)
BILIRUBIN TOTAL: 0.9 mg/dL (ref 0.3–1.2)
BUN: 8 mg/dL (ref 6–20)
CALCIUM: 8.9 mg/dL (ref 8.9–10.3)
CO2: 21 mmol/L — ABNORMAL LOW (ref 22–32)
Chloride: 103 mmol/L (ref 101–111)
Creatinine, Ser: 0.42 mg/dL — ABNORMAL LOW (ref 0.44–1.00)
GFR calc non Af Amer: >60 mL/min (ref >60)
Glucose, Bld: 99 mg/dL (ref 65–99)
Potassium: 3.6 mmol/L (ref 3.5–5.1)
Sodium: 136 mmol/L (ref 135–145)
TOTAL PROTEIN: 6.5 g/dL (ref 6.5–8.1)

## 2017-04-28 LAB — URINALYSIS, ROUTINE W REFLEX MICROSCOPIC
GLUCOSE, UA: NEGATIVE mg/dL
HGB URINE DIPSTICK: NEGATIVE
KETONES UR: 80 mg/dL — AB
NITRITE: NEGATIVE
PROTEIN: 100 mg/dL — AB
Specific Gravity, Urine: 1.026 (ref 1.005–1.030)
pH: 6 (ref 5.0–8.0)

## 2017-04-28 LAB — CBC
HCT: 36 % (ref 36.0–46.0)
HEMOGLOBIN: 12.7 g/dL (ref 12.0–15.0)
MCH: 30.7 pg (ref 26.0–34.0)
MCHC: 35.3 g/dL (ref 30.0–36.0)
MCV: 87 fL (ref 78.0–100.0)
Platelets: 332 10*3/uL (ref 150–400)
RBC: 4.14 MIL/uL (ref 3.87–5.11)
RDW: 14.5 % (ref 11.5–15.5)
WBC: 20.8 10*3/uL — ABNORMAL HIGH (ref 4.0–10.5)

## 2017-04-28 LAB — LIPASE, BLOOD: Lipase: 22 U/L (ref 11–51)

## 2017-04-28 MED ORDER — SODIUM CHLORIDE 0.9 % IV SOLN
8.0000 mg | Freq: Three times a day (TID) | INTRAVENOUS | Status: DC | PRN
  Administered 2017-04-28: 8 mg via INTRAVENOUS
  Filled 2017-04-28: qty 4

## 2017-04-28 MED ORDER — LACTATED RINGERS IV SOLN
INTRAVENOUS | Status: DC
  Administered 2017-04-28 – 2017-04-29 (×3): via INTRAVENOUS

## 2017-04-28 MED ORDER — OXYCODONE-ACETAMINOPHEN 5-325 MG PO TABS
1.0000 | ORAL_TABLET | Freq: Four times a day (QID) | ORAL | Status: DC | PRN
  Administered 2017-04-28 – 2017-04-29 (×2): 2 via ORAL
  Administered 2017-04-29: 1 via ORAL
  Filled 2017-04-28: qty 1
  Filled 2017-04-28 (×2): qty 2

## 2017-04-28 MED ORDER — HYDROMORPHONE HCL 1 MG/ML IJ SOLN
1.0000 mg | Freq: Once | INTRAMUSCULAR | Status: AC
  Administered 2017-04-28: 1 mg via INTRAVENOUS
  Filled 2017-04-28: qty 1

## 2017-04-28 MED ORDER — ACETAMINOPHEN 325 MG PO TABS
650.0000 mg | ORAL_TABLET | ORAL | Status: DC | PRN
  Administered 2017-04-29: 650 mg via ORAL
  Filled 2017-04-28: qty 2

## 2017-04-28 MED ORDER — ZOLPIDEM TARTRATE 5 MG PO TABS
5.0000 mg | ORAL_TABLET | Freq: Every evening | ORAL | Status: DC | PRN
  Administered 2017-04-28: 5 mg via ORAL
  Filled 2017-04-28: qty 1

## 2017-04-28 MED ORDER — DEXTROSE 5 % IN LACTATED RINGERS IV BOLUS
1000.0000 mL | Freq: Once | INTRAVENOUS | Status: AC
  Administered 2017-04-28: 1000 mL via INTRAVENOUS

## 2017-04-28 MED ORDER — GI COCKTAIL ~~LOC~~
30.0000 mL | Freq: Once | ORAL | Status: AC
  Administered 2017-04-28: 30 mL via ORAL
  Filled 2017-04-28: qty 30

## 2017-04-28 MED ORDER — FAMOTIDINE IN NACL 20-0.9 MG/50ML-% IV SOLN
20.0000 mg | Freq: Two times a day (BID) | INTRAVENOUS | Status: DC
  Administered 2017-04-28: 20 mg via INTRAVENOUS
  Filled 2017-04-28 (×2): qty 50

## 2017-04-28 MED ORDER — PRENATAL MULTIVITAMIN CH
1.0000 | ORAL_TABLET | Freq: Every day | ORAL | Status: DC
  Administered 2017-04-29: 1 via ORAL
  Filled 2017-04-28 (×2): qty 1

## 2017-04-28 MED ORDER — DEXTROSE IN LACTATED RINGERS 5 % IV SOLN
INTRAVENOUS | Status: DC
  Administered 2017-04-28 – 2017-04-29 (×2): via INTRAVENOUS

## 2017-04-28 MED ORDER — FAMOTIDINE IN NACL 20-0.9 MG/50ML-% IV SOLN
20.0000 mg | Freq: Once | INTRAVENOUS | Status: AC
  Administered 2017-04-28: 20 mg via INTRAVENOUS
  Filled 2017-04-28: qty 50

## 2017-04-28 MED ORDER — DOCUSATE SODIUM 100 MG PO CAPS
100.0000 mg | ORAL_CAPSULE | Freq: Every day | ORAL | Status: DC
  Administered 2017-04-29: 100 mg via ORAL
  Filled 2017-04-28: qty 1

## 2017-04-28 NOTE — H&P (Signed)
Kari Pearson is a 31 y.o. female presenting for fairly acute onset midepigastric pain, arrived MAU via EMS, initial NP eval showed normal CMET, lipase, essent nl UA and NEG RUQ US.Marland Kitchen.  Her WBC 20,000 and I subseq ordered OB complete US>>WNL>>  rec overnight eval for GI cocktail/acid reducers and obsv, with repeat CBC in am, poss decide then re additional imaging. OB History    Gravida Para Term Preterm AB Living   3 2 2     2    SAB TAB Ectopic Multiple Live Births           2     Past Medical History:  Diagnosis Date  . Asthma    Past Surgical History:  Procedure Laterality Date  . CESAREAN SECTION    . LAPAROSCOPY  05/2016   Polyps   Family History: family history includes Diabetes in her maternal grandmother. Social History:  reports that she has been smoking Cigarettes.  She has been smoking about 1.00 pack per day. She has never used smokeless tobacco. She reports that she does not drink alcohol or use drugs.     Maternal Diabetes: No Genetic Screening: Normal Maternal Ultrasounds/Referrals: Normal Fetal Ultrasounds or other Referrals:  None Maternal Substance Abuse:  No Significant Maternal Medications:  None Significant Maternal Lab Results:  None Other Comments:  None  ROS History   Blood pressure (!) 112/58, pulse 93, temperature 98.1 F (36.7 C), temperature source Oral, resp. rate 20, SpO2 99 %. Exam Physical Exam  Prenatal labs: ABO, Rh:   Antibody:   Rubella:   RPR:    HBsAg:    HIV:    GBS:     Assessment/Plan: 7060w1d midepigastric pain with elevated WBC admi for obsv   Kari Pearson M 04/28/2017, 7:32 PM

## 2017-04-28 NOTE — MAU Note (Signed)
Pt presents via EMS with c/o right to mid epigastric pain that began @1130  this morning and progressively got worse.  Pt reports she ate bacon & eggs for breakfast @ 0800.  Pt states she does have her gallbladder.

## 2017-04-28 NOTE — MAU Provider Note (Signed)
History     CSN: [id number]  Arrival date and time: 04/28/17 1309   First Provider Initiated Contact with Patient 04/28/17 1336      Chief Complaint  Patient presents with  . Epigastric Pain   HPI   Ms.Kari Pearson is a 31 y.o. female G64P2002 @ 39w1dhere in MAU with right upper quadrant pain/epigastric pain.The patient came to MAU via EMS. She thought the pain was indigestion, and she took some tums, however this did not help. The pain came on suddenly and with Nausea and vomiting. She had eggs and bacon for breakfast, nothing greasy or fried for dinner last night.  Has never had this pain before. The pain worsens when she takes a deep breath in or coughs. At times the pain radiates around to her right upper side and around to her upper back.  She denies SOB. + Smoker. + fetal movement. Denies contraction pain.   OB History    Gravida Para Term Preterm AB Living   _0 SAB TAB Ectopic Multiple Live Births           2      Past Medical History:  Diagnosis Date  . Asthma     Past Surgical History:  Procedure Laterality Date  . CESAREAN SECTION    . LAPAROSCOPY  05/2016   Polyps    Family History  Problem Relation Age of Onset  . Diabetes Maternal Grandmother     Social History  Substance Use Topics  . Smoking status: Current Every Day Smoker    Packs/day: 1.00    Types: Cigarettes  . Smokeless tobacco: Never Used  . Alcohol use No    Allergies:  Allergies  Allergen Reactions  . Penicillins Hives    Has patient had a PCN reaction causing immediate rash, facial/tongue/throat swelling, SOB or lightheadedness with hypotension: Yes Has patient had a PCN reaction causing severe rash involving mucus membranes or skin necrosis: No Has patient had a PCN reaction that required hospitalization: No Has patient had a PCN reaction occurring within the last 10 years: No If all of the above answers are "NO", then may proceed with Cephalosporin use.     Prescriptions Prior to Admission  Medication Sig Dispense Refill Last Dose  . albuterol (PROVENTIL HFA;VENTOLIN HFA) 108 (90 Base) MCG/ACT inhaler Inhale 2 puffs into the lungs every 4 (four) hours as needed for wheezing.   prn  . butalbital-acetaminophen-caffeine (FIORICET, ESGIC) 50-325-40 MG tablet Take 1 tablet by mouth every 6 (six) hours as needed for headache.   Past Week at Unknown time  . ondansetron (ZOFRAN) 8 MG tablet Take 8 mg by mouth every 8 (eight) hours as needed for nausea or vomiting.   Past Week at Unknown time  . Prenatal Vit-Fe Fumarate-FA (PRENATAL MULTIVITAMIN) TABS tablet Take 1 tablet by mouth daily at 12 noon.   04/28/2017 at Unknown time   Results for orders placed or performed during the hospital encounter of 04/28/17 (from the past 48 hour(s))  Lipase, blood     Status: None   Collection Time: 04/28/17  1:52 PM  Result Value Ref Range   Lipase 22 11 - 51 U/L  Comprehensive metabolic panel     Status: Abnormal   Collection Time: 04/28/17  1:52 PM  Result Value Ref Range   Sodium 136 135 - 145 mmol/L   Potassium 3.6 3.5 - 5.1 mmol/L   Chloride 103 101 - 111  mmol/L   CO2 21 (L) 22 - 32 mmol/L   Glucose, Bld 99 65 - 99 mg/dL   BUN 8 6 - 20 mg/dL   Creatinine, Ser 0.42 (L) 0.44 - 1.00 mg/dL   Calcium 8.9 8.9 - 10.3 mg/dL   Total Protein 6.5 6.5 - 8.1 g/dL   Albumin 3.6 3.5 - 5.0 g/dL   AST 30 15 - 41 U/L   ALT 15 14 - 54 U/L   Alkaline Phosphatase 113 38 - 126 U/L   Total Bilirubin 0.9 0.3 - 1.2 mg/dL   GFR calc non Af Amer >60 >60 mL/min   GFR calc Af Amer >60 >60 mL/min    Comment: (NOTE) The eGFR has been calculated using the CKD EPI equation. This calculation has not been validated in all clinical situations. eGFR's persistently <60 mL/min signify possible Chronic Kidney Disease.    Anion gap 12 5 - 15  CBC     Status: Abnormal   Collection Time: 04/28/17  1:52 PM  Result Value Ref Range   WBC 20.8 (H) 4.0 - 10.5 K/uL   RBC 4.14 3.87 - 5.11  MIL/uL   Hemoglobin 12.7 12.0 - 15.0 g/dL   HCT 36.0 36.0 - 46.0 %   MCV 87.0 78.0 - 100.0 fL   MCH 30.7 26.0 - 34.0 pg   MCHC 35.3 30.0 - 36.0 g/dL   RDW 14.5 11.5 - 15.5 %   Platelets 332 150 - 400 K/uL  Urinalysis, Routine w reflex microscopic     Status: Abnormal   Collection Time: 04/28/17  2:19 PM  Result Value Ref Range   Color, Urine AMBER (A) YELLOW    Comment: BIOCHEMICALS MAY BE AFFECTED BY COLOR   APPearance CLOUDY (A) CLEAR   Specific Gravity, Urine 1.026 1.005 - 1.030   pH 6.0 5.0 - 8.0   Glucose, UA NEGATIVE NEGATIVE mg/dL   Hgb urine dipstick NEGATIVE NEGATIVE   Bilirubin Urine SMALL (A) NEGATIVE   Ketones, ur 80 (A) NEGATIVE mg/dL   Protein, ur 100 (A) NEGATIVE mg/dL   Nitrite NEGATIVE NEGATIVE   Leukocytes, UA LARGE (A) NEGATIVE   RBC / HPF 0-5 0 - 5 RBC/hpf   WBC, UA 0-5 0 - 5 WBC/hpf   Bacteria, UA FEW (A) NONE SEEN   Squamous Epithelial / LPF TOO NUMEROUS TO COUNT (A) NONE SEEN   Mucus PRESENT    Review of Systems  Gastrointestinal: Positive for abdominal pain, nausea and vomiting. Negative for constipation and diarrhea.  Genitourinary: Negative for dyspareunia, dysuria, enuresis, flank pain, frequency and vaginal bleeding.   Physical Exam   Blood pressure (!) 112/58, pulse 93, temperature 98.1 F (36.7 C), temperature source Oral, resp. rate 20, SpO2 99 %.  Physical Exam  Constitutional: She is oriented to person, place, and time.  HENT:  Head: Normocephalic.  Cardiovascular: Normal rate.   Respiratory: Effort normal.  GI: Soft. Normal appearance. There is tenderness in the epigastric area. There is CVA tenderness (Mild, bilateral ).    Negative for intercostale pain.   Musculoskeletal: Normal range of motion.  Neurological: She is alert and oriented to person, place, and time.  Skin: Skin is warm.  Psychiatric: Her behavior is normal.   Fetal Tracing: Baseline: 135 bpm Variability: Moderate  Accelerations: 10x10 Decelerations:  None Toco: quiet   MAU Course  Procedures  None  MDM  UA Lipase, CBC, & CMP LR bolus X 1 D5LR bolus X 1 Dilaudid 1 mg X 1 RUQ US   ordered- normal Urine culture pending  Discussed with Dr. Holland> US OB complete ordered Additional 1 mg of dilaudid given IV Pepcid 20 mg IV  Patient continues to experience significant pain in the epigastric area. Discussed with Dr. Holland> will admit for pain management and the potential for further imaging.  Dr. Holland at bedside.  EKG: WNL  Assessment and Plan   A:  Epigastric pain vs hiatal vs gerd  Unlikely costochondritis    P:  Admit per Dr. Holland.    Rasch, Jennifer I, NP 04/28/2017 8:14 PM  

## 2017-04-29 DIAGNOSIS — R1013 Epigastric pain: Principal | ICD-10-CM

## 2017-04-29 DIAGNOSIS — D72829 Elevated white blood cell count, unspecified: Secondary | ICD-10-CM | POA: Diagnosis not present

## 2017-04-29 LAB — CBC
HEMATOCRIT: 31.8 % — AB (ref 36.0–46.0)
HEMOGLOBIN: 11.2 g/dL — AB (ref 12.0–15.0)
MCH: 31.1 pg (ref 26.0–34.0)
MCHC: 35.2 g/dL (ref 30.0–36.0)
MCV: 88.3 fL (ref 78.0–100.0)
PLATELETS: 287 10*3/uL (ref 150–400)
RBC: 3.6 MIL/uL — AB (ref 3.87–5.11)
RDW: 14.7 % (ref 11.5–15.5)
WBC: 12.4 10*3/uL — ABNORMAL HIGH (ref 4.0–10.5)

## 2017-04-29 MED ORDER — PANTOPRAZOLE SODIUM 40 MG IV SOLR
40.0000 mg | INTRAVENOUS | Status: DC
  Administered 2017-04-29: 40 mg via INTRAVENOUS
  Filled 2017-04-29 (×2): qty 40

## 2017-04-29 MED ORDER — PANTOPRAZOLE SODIUM 40 MG PO TBEC
40.0000 mg | DELAYED_RELEASE_TABLET | Freq: Two times a day (BID) | ORAL | Status: DC
  Filled 2017-04-29: qty 1

## 2017-04-29 MED ORDER — PANTOPRAZOLE SODIUM 40 MG PO TBEC: 40.0000 mg | DELAYED_RELEASE_TABLET | Freq: Two times a day (BID) | ORAL | 4 refills | Status: AC

## 2017-04-29 MED ORDER — OXYCODONE-ACETAMINOPHEN 5-325 MG PO TABS: 1.0000 | ORAL_TABLET | Freq: Four times a day (QID) | ORAL | 0 refills | Status: AC | PRN

## 2017-04-29 NOTE — Progress Notes (Signed)
Discharge instructions reviewed with patient.  Discussed follow up appointments, medications and smoking cessation.

## 2017-04-29 NOTE — Progress Notes (Signed)
Pt feeling a little better but still having epigastric pain.  Nausea resolved.  No f/c, ctx, vb or LOF.  Good FM.  Denies dysuria.  Pt reports pain is worse w/ cough & did have bronchitis x 2 wks last month.    AF, VSS Gen - NAD Abd - soft, ND.  Tender epigastrium.  Fundus NT Ext - NT, no edema  WBC 12.4 (decrease from 20) RUQ ultrasound - normal by verbal report (final report pending) Pelvic US - normal by verbal report (final report pending)   A/P:  23+2 weeks with epigastric pain - WBC normalizing and pain improving but still present Plan to switch from pepcid to protonix Allow regular diet If no improvement - will consult GI

## 2017-04-29 NOTE — Progress Notes (Signed)
Pt continues to c/o sharp, stabbing epigastric pain.  Improved from yesterday but pt still requesting pain meds.  She was able tolerate breakfast, no improvement with IV protonix.   AF, VSS Gen - NAD but tearful Abd - epigastric tenderness Ext - NT  A/P:  Will consult GI to further evaluate pain as basic dyspepsia treatment not providing much relief. Ok for CT scan if will be helpful

## 2017-04-29 NOTE — Discharge Summary (Signed)
Physician Discharge Summary  Patient ID: Kari Pearson MRN: [id number] DOB/AGE: [birth year] 31 y.o.  Admit date: 04/28/2017 Discharge date: 04/29/2017  Admission Diagnoses: epigastric pain  Discharge Diagnoses:  Active Problems:   Epigastric pain   Discharged Condition: stable  Hospital Course: Pt was admitted with epigastric pain.  Pain improved but not resolved with IV pepcid.  Ultrasound imaging normal and GI consult done - recommendation to continue protonix twice daily.  Pt was discharged home with close follow up.    Consults: GI  Significant Diagnostic Studies: labs: cbc  Treatments: IV hydration and analgesia: acetaminophen and percocet  Discharge Exam: Blood pressure (!) 106/54, pulse (!) 101, temperature 98.6 F (37 C), temperature source Oral, resp. rate 18, SpO2 99 %. General appearance: alert and cooperative GI: soft, mild epigastric tenderness  Disposition: 01-Home or Self Care  Discharge Instructions    Diet - low sodium heart healthy    Complete by:  As directed    Increase activity slowly    Complete by:  As directed      Allergies as of 04/29/2017      Reactions   Penicillins Hives   Has patient had a PCN reaction causing immediate rash, facial/tongue/throat swelling, SOB or lightheadedness with hypotension: Yes Has patient had a PCN reaction causing severe rash involving mucus membranes or skin necrosis: No Has patient had a PCN reaction that required hospitalization: No Has patient had a PCN reaction occurring within the last 10 years: No If all of the above answers are "NO", then may proceed with Cephalosporin use.      Medication List    STOP taking these medications   butalbital-acetaminophen-caffeine 50-325-40 MG tablet Commonly known as:  FIORICET, ESGIC     TAKE these medications   albuterol 108 (90 Base) MCG/ACT inhaler Commonly known as:  PROVENTIL HFA;VENTOLIN HFA Inhale 2 puffs into the lungs every 4 (four) hours as needed for  wheezing.   ondansetron 8 MG tablet Commonly known as:  ZOFRAN Take 8 mg by mouth every 8 (eight) hours as needed for nausea or vomiting.   oxyCODONE-acetaminophen 5-325 MG tablet Commonly known as:  PERCOCET/ROXICET Take 1-2 tablets by mouth every 6 (six) hours as needed for severe pain.   pantoprazole 40 MG tablet Commonly known as:  PROTONIX Take 1 tablet (40 mg total) by mouth 2 (two) times daily.   prenatal multivitamin Tabs tablet Take 1 tablet by mouth daily at 12 noon.            Discharge Care Instructions        Start     Ordered   04/29/17 0000  pantoprazole (PROTONIX) 40 MG tablet  2 times daily     04/29/17 1632   04/29/17 0000  oxyCODONE-acetaminophen (PERCOCET/ROXICET) 5-325 MG tablet  Every 6 hours PRN     04/29/17 1632   04/29/17 0000  Increase activity slowly     04/29/17 1632   04/29/17 0000  Diet - low sodium heart healthy     04/29/17 1632       Signed: Jood Retana 04/29/2017, 4:32 PM

## 2017-04-29 NOTE — Consult Note (Addendum)
[facility] Consult: 2:06 PM 04/29/2017  LOS: 0 days    Referring Provider: Dr Marcelle Overlie  Primary Care Physician:  Joette Catching, MD in [facility] Primary Gastroenterologist:  unassigned     Reason for Consultation:  Epigastric pain   HPI: Kari Pearson is a 31 y.o. female.  PMH asthma.  C section.  23 weeks 2 days pregnant.  G4 P3 1 miscarriage.  S/p EGDs ([place] [facility] in [place]) x 2 @ age 2 for : GER vs PUD.  Treated with PPI and abx for H pylori infection.  Not sure how long PPI continued but has not taken PPI or had reflux sxs for many years. Hx recurrent sinusitis and bronchitis.  Anxiety/depression, panic disorder.  Obesity.  Hx menorrhagia.  Iron def anemia 2015.     2.5 weeks ago treated with Z pack for URI, bronchitis.  Sxs have resolved.  She smokes 1/2 to 1 pack cigs per day. Ate her usual eggs with bacon 8 AM yesterday.  111 AM sudden onset intense 8/10 epigastric pain.  Nausea with bilious emesis.  Thought she was having a heart attack.  EMS transport to ED.  Pain better after Dilaudid and now with Percocet.  However pain intensifies after meds wear off.  Pain increased with coughing.   Not a lot of change after po and tolerating solids.  BMs normal, brown.    Pt thinks current sxs not unlike those at age 67.     WBCs 20K>> 12.4.  LFTs and Lipase normal.  U/A poor specimen but few bacteria and 0-5 RBCs, large leukocytes, no nitrates.  Urine clx processing.    Abdominal ultrasound with doppler:  Unremarkable GB, 3.4 mm CBD.  Patent PV    Past Medical History:  Diagnosis Date  . Asthma     Past Surgical History:  Procedure Laterality Date  . CESAREAN SECTION    . LAPAROSCOPY  05/2016   Polyps    Prior to Admission medications   Medication Sig Start Date End Date Taking?  Authorizing Provider  albuterol (PROVENTIL HFA;VENTOLIN HFA) 108 (90 Base) MCG/ACT inhaler Inhale 2 puffs into the lungs every 4 (four) hours as needed for wheezing. 04/10/17 04/10/18 Yes [provider]  butalbital-acetaminophen-caffeine (FIORICET, ESGIC) 50-325-40 MG tablet Take 1 tablet by mouth every 6 (six) hours as needed for headache.   Yes [provider]  ondansetron (ZOFRAN) 8 MG tablet Take 8 mg by mouth every 8 (eight) hours as needed for nausea or vomiting.   Yes [provider]  Prenatal Vit-Fe Fumarate-FA (PRENATAL MULTIVITAMIN) TABS tablet Take 1 tablet by mouth daily at 12 noon.   Yes [provider]    Scheduled Meds: . docusate sodium  100 mg Oral Daily  . pantoprazole (PROTONIX) IV  40 mg Intravenous Q24H  . prenatal multivitamin  1 tablet Oral Q1200   Infusions: . dextrose 5% lactated ringers 125 mL/hr at 04/29/17 0532  . lactated ringers 125 mL/hr at 04/29/17 1345  . ondansetron [facility]) IV Stopped (04/28/17 2117)   PRN Meds:  acetaminophen, ondansetron (ZOFRAN) IV, oxyCODONE-acetaminophen, zolpidem   Allergies as of 04/28/2017 - Review Complete 04/28/2017  Allergen Reaction Noted  . Penicillins Hives 03/13/2017    Family History  Problem Relation Age of Onset  . Diabetes Maternal Grandmother     Social History   Social History  . Marital status: Single    Spouse name: N/A  . Number of children: N/A  . Years of education: N/A   Occupational History  . Not on file.   Social History Main Topics  . Smoking status: Current Every Day Smoker    Packs/day: 1.00    Types: Cigarettes  . Smokeless tobacco: Never Used  . Alcohol use No  . Drug use: No  . Sexual activity: Not on file   Other Topics Concern  . Not on file   Social History Narrative  . No narrative on file    REVIEW OF SYSTEMS: Constitutional:  Generally not weak.  No fatigue.   ENT:  No nose bleeds Pulm:  Per HPI CV:  No palpitations, no LE edema.   GU:  No hematuria, no frequency GI:  Per HPI Heme:  No unusual bleeding/bruising   Transfusions:  none Neuro:  No headaches, no peripheral tingling or numbness Derm:  No itching, no rash or sores.  Endocrine:  No sweats or chills.  No polyuria or dysuria Immunization:  tdap 05/2014.  Flu 08/2015.   Travel:  None beyond local counties in last few months.    PHYSICAL EXAM: Vital signs in last 24 hours: Vitals:   04/29/17 0804 04/29/17 1209  BP: (!) 107/55 (!) 106/54  Pulse: 90 (!) 102  Resp: 18 18  Temp: 98.7 F (37.1 C) 98.5 F (36.9 C)  SpO2: 100% 99%   Wt Readings from Last 3 Encounters:  03/13/17 72.1 kg (159 lb)    General: overweight, non-ill looking WF Head:  No assymetry or facial swelling.    Eyes:  No icterus or pallor Ears:  nont HOH  Nose:  No discharge or congestion Mouth:  Moist, clear oral MM.  Tongue midline Neck:  No TMG, masses or JVD Lungs:  Clear bil.  No dyspnea or cough Heart: RRR.  No MRG Abdomen:  Soft, protuberant c/w pregnancy.  No mass or HSM.  No hernias or bruits.  Slight epigastric tenderness.  .   Rectal: deferred   Musc/Skeltl: no joint swelling, redness or deformity Extremities:  No CCE  Neurologic:  Alert, oriented x 3.  Moves all 4 limbs, full strength.  No tremor Skin:  No telangectasia, rashes, purpura Nodes:  No cervical adenopathy   Psych:  Cooperative, pleasant, not depressed or overtly anxious.   Intake/Output from previous day: No intake/output data recorded. Intake/Output this shift: Total I/O In: -  Out: 700 [Urine:700]  LAB RESULTS:  Recent Labs  04/28/17 1352 04/29/17 0612  WBC 20.8* 12.4*  HGB 12.7 11.2*  HCT 36.0 31.8*  PLT 332 287   BMET Lab Results  Component Value Date   NA 136 04/28/2017   K 3.6 04/28/2017   CL 103 04/28/2017   CO2 21 (L) 04/28/2017   GLUCOSE 99 04/28/2017   GLUCOSE 128 (H) 11/15/2009   BUN 8 04/28/2017   CREATININE 0.42 (L) 04/28/2017   CALCIUM 8.9 04/28/2017    LFT  Recent Labs  04/28/17 1352  PROT 6.5  ALBUMIN 3.6  AST 30  ALT 15  ALKPHOS 113  BILITOT 0.9   PT/INR No results found for: INR, PROTIME Hepatitis  Panel No results for input(s): HEPBSAG, HCVAB, HEPAIGM, HEPBIGM in the last 72 hours. C-Diff No components found for: CDIFF Lipase     Component Value Date/Time   LIPASE 22 04/28/2017 1352    Drugs of Abuse  No results found for: LABOPIA, COCAINSCRNUR, LABBENZ, AMPHETMU, THCU, LABBARB   RADIOLOGY STUDIES: [place] Abdomen Limited Ruq  Result Date: 04/28/2017 CLINICAL DATA:  Upper abdomen pain EXAM: ULTRASOUND ABDOMEN LIMITED RIGHT UPPER QUADRANT COMPARISON:  None. FINDINGS: Gallbladder: No gallstones or wall thickening visualized. No sonographic Murphy sign noted by sonographer. Common bile duct: Diameter: 3.4 mm Liver: No focal lesion identified. Within normal limits in parenchymal echogenicity. Portal vein is patent on color Doppler imaging with normal direction of blood flow towards the liver. IMPRESSION: Normal right upper quadrant ultrasound. Electronically Signed   By: Sherian ReinWei-Chen  Lin M.D.   On: 04/28/2017 15:40     IMPRESSION:   *  Acute epigastric pain.  Unremarkable abd ultrasound and LFTS/Lipase.  Elevated WBCs improved.  Hx PUD vs GERD with H Pylori infection treated at age 31   *  4123 weeks, 2 days, pregnant.      PLAN:     *  Per DR Lavon PaganiniNandigam.   Continue BID Protonix but switched to po.      Jennye MoccasinSarah Gribbin  04/29/2017, 2:06 PM Pager: [phone]   Attending physician's note   I have taken a history, examined the patient and reviewed the chart. I agree with the Advanced Practitioner's note, impression and recommendations. Acute onset epigastric abdominal pain. Exam is unremarkable, non tender with no rebound, gravid uterus.  No evidence of gallbladder disease or pancreatitis based on labs or abdominal ultrasound Patient was tearful and extremely anxious. She feels pain meds are helping but pain recurs once the  narcotic med wears off, requesting medication for pain control. Advised patient to discuss with Dr Marcelle OverlieHolland regarding pain control Leucocytosis improved. UA appears dirty with bacteria, leucocytes Continue PPI Carafate suspension 1 gm before meals and at bedtime No further work up at this point Will sign off, available for any questions  K Scherry RanVeena Nandigam, MD [phone] Mon-Fri 8a-5p [phone] after 5p, weekends, holidays

## 2017-04-30 LAB — CULTURE, OB URINE: SPECIAL REQUESTS: NORMAL

## 2017-08-04 ENCOUNTER — Encounter (HOSPITAL_COMMUNITY): Payer: Self-pay

## 2017-08-15 ENCOUNTER — Encounter (HOSPITAL_COMMUNITY)
Admission: RE | Admit: 2017-08-15 | Discharge: 2017-08-15 | Disposition: A | Discharge status: Home / Self Care | Payer: Medicaid Other | Source: Ambulatory Visit | Attending: Obstetrics and Gynecology | Admitting: Obstetrics and Gynecology

## 2017-08-15 HISTORY — DX: Calculus of gallbladder without cholecystitis without obstruction: K80.20

## 2017-08-15 HISTORY — DX: Major depressive disorder, single episode, unspecified: F32.9

## 2017-08-15 HISTORY — DX: Gestational diabetes mellitus in pregnancy, unspecified control: O24.419

## 2017-08-15 HISTORY — DX: Depression, unspecified: F32.A

## 2017-08-15 LAB — CBC
HCT: 36.9 % (ref 36.0–46.0)
Hemoglobin: 12.3 g/dL (ref 12.0–15.0)
MCH: 28.4 pg (ref 26.0–34.0)
MCHC: 33.3 g/dL (ref 30.0–36.0)
MCV: 85.2 fL (ref 78.0–100.0)
Platelets: 416 10*3/uL — ABNORMAL HIGH (ref 150–400)
RBC: 4.33 MIL/uL (ref 3.87–5.11)
RDW: 14.4 % (ref 11.5–15.5)
WBC: 14.4 10*3/uL — ABNORMAL HIGH (ref 4.0–10.5)

## 2017-08-15 LAB — TYPE AND SCREEN
ABO/RH(D): O POS
ANTIBODY SCREEN: NEGATIVE

## 2017-08-15 LAB — ABO/RH: ABO/RH(D): O POS

## 2017-08-15 NOTE — H&P (Addendum)
Kari DrownSarah D Pearson is a 31 y.o. female presenting for repeat C/S and BTL.  Preg uncomplicated.  GBS+. OB History    Gravida Para Term Preterm AB Living   4 2 2   1 2    SAB TAB Ectopic Multiple Live Births   1       2     Past Medical History:  Diagnosis Date  . Asthma   . Depression    PP depression vs anxiety last pregnancy  . Gall stones   . Gestational diabetes    first pregnancy   Past Surgical History:  Procedure Laterality Date  . CESAREAN SECTION     had abcess with second CS  . COMBINED HYSTEROSCOPY DIAGNOSTIC / D&C    . LAPAROSCOPY  05/2016   Polyps   Family History: family history includes COPD in her maternal grandfather; Cervical cancer in her paternal grandmother; Diabetes in her maternal grandfather; Hypertension in her maternal grandfather and mother; Kidney Stones in her maternal grandmother and mother. Social History:  reports that she has been smoking cigarettes.  She has been smoking about 1.00 pack per day. she has never used smokeless tobacco. She reports that she does not drink alcohol or use drugs.     Maternal Diabetes: No Genetic Screening: Normal Maternal Ultrasounds/Referrals: Normal Fetal Ultrasounds or other Referrals:  None Maternal Substance Abuse:  No Significant Maternal Medications:  None Significant Maternal Lab Results:  None Other Comments:  None  ROS History   There were no vitals taken for this visit. Exam Physical Exam  Prenatal labs: ABO, Rh: O/Positive/-- (05/24 0000) Antibody: Negative (05/24 0000) Rubella: Immune (05/24 0000) RPR: Nonreactive (05/24 0000)  HBsAg: Negative (05/24 0000)  HIV: Non-reactive (05/24 0000)  GBS: Positive (05/24 0000)   Assessment/Plan: IUP at term Prev C/S x 2 for repeat Desires sterilization Has taken cefotetan in past without difficulty Risks and benefits of C/S were discussed.  All questions were answered and informed consent was obtained.  Plan to proceed with low segment transverse  Cesarean Section.Multiparity and desires sterility.  Discussed risks and benefits of sterilization including but not limited to risk of tubal failure quoted as 12/998.  She gives her informed consent.   Kari Pearson C 08/15/2017, 8:38 AM   This patient has been seen and examined.   All of her questions were answered.  Labs and vital signs reviewed.  Informed consent has been obtained.  The History and Physical is current. 08/18/17 0715 DL

## 2017-08-15 NOTE — Patient Instructions (Signed)
Kari DrownSarah D Pearson  08/15/2017   Your procedure is scheduled on:  08/18/2017  Enter through the Main Entrance of [facility] at 0530 AM.  Pick up the phone at the desk and dial [phone]  Call this number if you have problems the morning of surgery:[phone]  Remember:   Do not eat food:After Midnight.  Do not drink clear liquids: After Midnight.  Take these medicines the morning of surgery with A SIP OF WATER: may take your protonix and please bring your inhaler   Do not wear jewelry, make-up or nail polish.  Do not wear lotions, powders, or perfumes. Do not wear deodorant.  Do not shave 48 hours prior to surgery.  Do not bring valuables to the hospital.  [facility] is not   responsible for any belongings or valuables brought to the hospital.  Contacts, dentures or bridgework may not be worn into surgery.  Leave suitcase in the car. After surgery it may be brought to your room.  For patients admitted to the hospital, checkout time is 11:00 AM the day of              discharge.    N/A   Please read over the following fact sheets that you were given:   Surgical Site Infection Prevention

## 2017-08-16 LAB — RPR: RPR: NONREACTIVE

## 2017-08-18 ENCOUNTER — Inpatient Hospital Stay (HOSPITAL_COMMUNITY): Payer: Medicaid Other | Admitting: Certified Registered Nurse Anesthetist

## 2017-08-18 ENCOUNTER — Encounter (HOSPITAL_COMMUNITY)
Admission: RE | Disposition: A | Discharge status: Home / Self Care | Payer: Self-pay | Source: Ambulatory Visit | Attending: Obstetrics and Gynecology

## 2017-08-18 ENCOUNTER — Encounter (HOSPITAL_COMMUNITY): Payer: Self-pay | Admitting: Anesthesiology

## 2017-08-18 ENCOUNTER — Inpatient Hospital Stay (HOSPITAL_COMMUNITY)
Admission: RE | Admit: 2017-08-18 | Discharge: 2017-08-20 | DRG: 785 | Disposition: A | Discharge status: Home / Self Care | Payer: Medicaid Other | Source: Ambulatory Visit | Attending: Obstetrics and Gynecology | Admitting: Obstetrics and Gynecology

## 2017-08-18 ENCOUNTER — Other Ambulatory Visit: Payer: Self-pay

## 2017-08-18 DIAGNOSIS — Z23 Encounter for immunization: Secondary | ICD-10-CM

## 2017-08-18 DIAGNOSIS — F17210 Nicotine dependence, cigarettes, uncomplicated: Secondary | ICD-10-CM | POA: Diagnosis present

## 2017-08-18 DIAGNOSIS — Z302 Encounter for sterilization: Secondary | ICD-10-CM | POA: Diagnosis not present

## 2017-08-18 DIAGNOSIS — Z3A39 39 weeks gestation of pregnancy: Secondary | ICD-10-CM | POA: Diagnosis not present

## 2017-08-18 DIAGNOSIS — O99824 Streptococcus B carrier state complicating childbirth: Secondary | ICD-10-CM | POA: Diagnosis present

## 2017-08-18 DIAGNOSIS — O99334 Smoking (tobacco) complicating childbirth: Secondary | ICD-10-CM | POA: Diagnosis present

## 2017-08-18 DIAGNOSIS — O34211 Maternal care for low transverse scar from previous cesarean delivery: Principal | ICD-10-CM | POA: Diagnosis present

## 2017-08-18 SURGERY — Surgical Case
Anesthesia: Spinal

## 2017-08-18 MED ORDER — MEPERIDINE HCL 25 MG/ML IJ SOLN
INTRAMUSCULAR | Status: DC | PRN
  Administered 2017-08-18 (×2): 12.5 mg via INTRAVENOUS

## 2017-08-18 MED ORDER — SIMETHICONE 80 MG PO CHEW
80.0000 mg | CHEWABLE_TABLET | Freq: Three times a day (TID) | ORAL | Status: DC
  Administered 2017-08-18 – 2017-08-19 (×4): 80 mg via ORAL
  Filled 2017-08-18 (×5): qty 1

## 2017-08-18 MED ORDER — NALOXONE HCL 0.4 MG/ML IJ SOLN: 0.4000 mg | INTRAMUSCULAR | Status: DC | PRN

## 2017-08-18 MED ORDER — ACETAMINOPHEN 325 MG PO TABS
650.0000 mg | ORAL_TABLET | ORAL | Status: DC | PRN
  Administered 2017-08-18 – 2017-08-20 (×9): 650 mg via ORAL
  Filled 2017-08-18 (×10): qty 2

## 2017-08-18 MED ORDER — HYDROMORPHONE HCL 1 MG/ML IJ SOLN
INTRAMUSCULAR | Status: AC
  Filled 2017-08-18: qty 1

## 2017-08-18 MED ORDER — SODIUM CHLORIDE 0.9% FLUSH: 3.0000 mL | INTRAVENOUS | Status: DC | PRN

## 2017-08-18 MED ORDER — NALOXONE HCL 0.4 MG/ML IJ SOLN
1.0000 ug/kg/h | INTRAMUSCULAR | Status: DC | PRN
  Filled 2017-08-18: qty 5

## 2017-08-18 MED ORDER — MEASLES, MUMPS & RUBELLA VAC ~~LOC~~ INJ
0.5000 mL | INJECTION | Freq: Once | SUBCUTANEOUS | Status: DC
  Filled 2017-08-18: qty 0.5

## 2017-08-18 MED ORDER — IBUPROFEN 600 MG PO TABS
600.0000 mg | ORAL_TABLET | Freq: Four times a day (QID) | ORAL | Status: DC
  Administered 2017-08-18 – 2017-08-20 (×7): 600 mg via ORAL
  Filled 2017-08-18 (×7): qty 1

## 2017-08-18 MED ORDER — PRENATAL MULTIVITAMIN CH
1.0000 | ORAL_TABLET | Freq: Every day | ORAL | Status: DC
  Administered 2017-08-18 – 2017-08-19 (×2): 1 via ORAL
  Filled 2017-08-18 (×2): qty 1

## 2017-08-18 MED ORDER — SIMETHICONE 80 MG PO CHEW
80.0000 mg | CHEWABLE_TABLET | ORAL | Status: DC
  Administered 2017-08-18 – 2017-08-20 (×2): 80 mg via ORAL
  Filled 2017-08-18 (×2): qty 1

## 2017-08-18 MED ORDER — DIPHENHYDRAMINE HCL 25 MG PO CAPS: 25.0000 mg | ORAL_CAPSULE | ORAL | Status: DC | PRN

## 2017-08-18 MED ORDER — OXYCODONE HCL 5 MG PO TABS: 5.0000 mg | ORAL_TABLET | Freq: Once | ORAL | Status: DC | PRN

## 2017-08-18 MED ORDER — CEFOTETAN DISODIUM-DEXTROSE 2-2.08 GM-%(50ML) IV SOLR
2.0000 g | INTRAVENOUS | Status: AC
  Administered 2017-08-18: 2 g via INTRAVENOUS
  Filled 2017-08-18: qty 50

## 2017-08-18 MED ORDER — HYDROMORPHONE HCL 1 MG/ML IJ SOLN
0.2500 mg | INTRAMUSCULAR | Status: DC | PRN
  Administered 2017-08-18 (×2): 0.25 mg via INTRAVENOUS
  Administered 2017-08-18: 0.5 mg via INTRAVENOUS

## 2017-08-18 MED ORDER — DIPHENHYDRAMINE HCL 50 MG/ML IJ SOLN: 12.5000 mg | INTRAMUSCULAR | Status: DC | PRN

## 2017-08-18 MED ORDER — DIBUCAINE 1 % RE OINT: 1.0000 "application " | TOPICAL_OINTMENT | RECTAL | Status: DC | PRN

## 2017-08-18 MED ORDER — MEPERIDINE HCL 25 MG/ML IJ SOLN: 6.2500 mg | INTRAMUSCULAR | Status: DC | PRN

## 2017-08-18 MED ORDER — ONDANSETRON HCL 4 MG/2ML IJ SOLN
INTRAMUSCULAR | Status: DC | PRN
  Administered 2017-08-18: 4 mg via INTRAVENOUS

## 2017-08-18 MED ORDER — OXYCODONE HCL 5 MG/5ML PO SOLN: 5.0000 mg | Freq: Once | ORAL | Status: DC | PRN

## 2017-08-18 MED ORDER — ONDANSETRON HCL 4 MG/2ML IJ SOLN
INTRAMUSCULAR | Status: AC
  Filled 2017-08-18: qty 2

## 2017-08-18 MED ORDER — ONDANSETRON HCL 4 MG/2ML IJ SOLN: 4.0000 mg | Freq: Three times a day (TID) | INTRAMUSCULAR | Status: DC | PRN

## 2017-08-18 MED ORDER — OXYCODONE HCL 5 MG PO TABS
5.0000 mg | ORAL_TABLET | ORAL | Status: DC | PRN
  Administered 2017-08-18 – 2017-08-20 (×5): 5 mg via ORAL
  Filled 2017-08-18 (×4): qty 1

## 2017-08-18 MED ORDER — LACTATED RINGERS IV SOLN
INTRAVENOUS | Status: DC | PRN
  Administered 2017-08-18: 40 [IU] via INTRAVENOUS

## 2017-08-18 MED ORDER — SCOPOLAMINE 1 MG/3DAYS TD PT72
1.0000 | MEDICATED_PATCH | TRANSDERMAL | Status: DC
  Administered 2017-08-18: 1.5 mg via TRANSDERMAL
  Filled 2017-08-18: qty 1

## 2017-08-18 MED ORDER — LACTATED RINGERS IV SOLN
INTRAVENOUS | Status: DC
  Administered 2017-08-18: 17:00:00 via INTRAVENOUS

## 2017-08-18 MED ORDER — WITCH HAZEL-GLYCERIN EX PADS: 1.0000 "application " | MEDICATED_PAD | CUTANEOUS | Status: DC | PRN

## 2017-08-18 MED ORDER — COCONUT OIL OIL: 1.0000 "application " | TOPICAL_OIL | Status: DC | PRN

## 2017-08-18 MED ORDER — KETOROLAC TROMETHAMINE 30 MG/ML IJ SOLN
30.0000 mg | Freq: Once | INTRAMUSCULAR | Status: DC | PRN
  Administered 2017-08-18: 30 mg via INTRAVENOUS

## 2017-08-18 MED ORDER — SIMETHICONE 80 MG PO CHEW: 80.0000 mg | CHEWABLE_TABLET | ORAL | Status: DC | PRN

## 2017-08-18 MED ORDER — NALBUPHINE HCL 10 MG/ML IJ SOLN: 5.0000 mg | Freq: Once | INTRAMUSCULAR | Status: DC | PRN

## 2017-08-18 MED ORDER — PNEUMOCOCCAL VAC POLYVALENT 25 MCG/0.5ML IJ INJ
0.5000 mL | INJECTION | INTRAMUSCULAR | Status: AC
  Administered 2017-08-20: 0.5 mL via INTRAMUSCULAR
  Filled 2017-08-18: qty 0.5

## 2017-08-18 MED ORDER — OXYTOCIN 10 UNIT/ML IJ SOLN
INTRAMUSCULAR | Status: AC
  Filled 2017-08-18: qty 4

## 2017-08-18 MED ORDER — DEXAMETHASONE SODIUM PHOSPHATE 4 MG/ML IJ SOLN
INTRAMUSCULAR | Status: DC | PRN
  Administered 2017-08-18: 4 mg via INTRAVENOUS

## 2017-08-18 MED ORDER — FENTANYL CITRATE (PF) 100 MCG/2ML IJ SOLN
INTRAMUSCULAR | Status: DC | PRN
  Administered 2017-08-18: 10 ug via INTRATHECAL

## 2017-08-18 MED ORDER — LACTATED RINGERS IV SOLN
INTRAVENOUS | Status: DC | PRN
  Administered 2017-08-18 (×3): via INTRAVENOUS

## 2017-08-18 MED ORDER — BUPIVACAINE IN DEXTROSE 0.75-8.25 % IT SOLN
INTRATHECAL | Status: DC | PRN
  Administered 2017-08-18: 1.6 mL via INTRATHECAL

## 2017-08-18 MED ORDER — NALBUPHINE HCL 10 MG/ML IJ SOLN: 5.0000 mg | INTRAMUSCULAR | Status: DC | PRN

## 2017-08-18 MED ORDER — OXYTOCIN 40 UNITS IN LACTATED RINGERS INFUSION - SIMPLE MED: 2.5000 [IU]/h | INTRAVENOUS | Status: AC

## 2017-08-18 MED ORDER — OXYCODONE HCL 5 MG PO TABS
10.0000 mg | ORAL_TABLET | ORAL | Status: DC | PRN
  Administered 2017-08-19 – 2017-08-20 (×6): 10 mg via ORAL
  Filled 2017-08-18 (×7): qty 2

## 2017-08-18 MED ORDER — SOD CITRATE-CITRIC ACID 500-334 MG/5ML PO SOLN
30.0000 mL | Freq: Once | ORAL | Status: AC
  Administered 2017-08-18: 30 mL via ORAL
  Filled 2017-08-18: qty 15

## 2017-08-18 MED ORDER — LACTATED RINGERS IV SOLN: INTRAVENOUS | Status: DC

## 2017-08-18 MED ORDER — SCOPOLAMINE 1 MG/3DAYS TD PT72
1.0000 | MEDICATED_PATCH | Freq: Once | TRANSDERMAL | Status: DC
  Filled 2017-08-18: qty 1

## 2017-08-18 MED ORDER — ZOLPIDEM TARTRATE 5 MG PO TABS: 5.0000 mg | ORAL_TABLET | Freq: Every evening | ORAL | Status: DC | PRN

## 2017-08-18 MED ORDER — LACTATED RINGERS IV SOLN
INTRAVENOUS | Status: DC
  Administered 2017-08-18: 08:00:00 via INTRAVENOUS

## 2017-08-18 MED ORDER — DIPHENHYDRAMINE HCL 25 MG PO CAPS: 25.0000 mg | ORAL_CAPSULE | Freq: Four times a day (QID) | ORAL | Status: DC | PRN

## 2017-08-18 MED ORDER — PHENYLEPHRINE 40 MCG/ML (10ML) SYRINGE FOR IV PUSH (FOR BLOOD PRESSURE SUPPORT)
PREFILLED_SYRINGE | INTRAVENOUS | Status: AC
  Filled 2017-08-18: qty 10

## 2017-08-18 MED ORDER — MORPHINE SULFATE (PF) 0.5 MG/ML IJ SOLN
INTRAMUSCULAR | Status: DC | PRN
  Administered 2017-08-18: .2 ug via INTRATHECAL

## 2017-08-18 MED ORDER — MENTHOL 3 MG MT LOZG
1.0000 | LOZENGE | OROMUCOSAL | Status: DC | PRN
Start: 2017-08-18 — End: 2017-08-20

## 2017-08-18 MED ORDER — KETOROLAC TROMETHAMINE 30 MG/ML IJ SOLN
INTRAMUSCULAR | Status: AC
  Filled 2017-08-18: qty 1

## 2017-08-18 MED ORDER — MEPERIDINE HCL 25 MG/ML IJ SOLN
INTRAMUSCULAR | Status: AC
  Filled 2017-08-18: qty 1

## 2017-08-18 MED ORDER — DEXAMETHASONE SODIUM PHOSPHATE 4 MG/ML IJ SOLN
INTRAMUSCULAR | Status: AC
  Filled 2017-08-18: qty 1

## 2017-08-18 MED ORDER — PHENYLEPHRINE 8 MG IN D5W 100 ML (0.08MG/ML) PREMIX OPTIME
INJECTION | INTRAVENOUS | Status: DC | PRN
  Administered 2017-08-18: 60 ug/min via INTRAVENOUS

## 2017-08-18 MED ORDER — SENNOSIDES-DOCUSATE SODIUM 8.6-50 MG PO TABS
2.0000 | ORAL_TABLET | ORAL | Status: DC
  Administered 2017-08-18 – 2017-08-20 (×2): 2 via ORAL
  Filled 2017-08-18 (×2): qty 2

## 2017-08-18 MED ORDER — MORPHINE SULFATE (PF) 0.5 MG/ML IJ SOLN
INTRAMUSCULAR | Status: AC
  Filled 2017-08-18: qty 10

## 2017-08-18 MED ORDER — BUPIVACAINE IN DEXTROSE 0.75-8.25 % IT SOLN
INTRATHECAL | Status: AC
  Filled 2017-08-18: qty 2

## 2017-08-18 MED ORDER — PHENYLEPHRINE 8 MG IN D5W 100 ML (0.08MG/ML) PREMIX OPTIME
INJECTION | INTRAVENOUS | Status: AC
  Filled 2017-08-18: qty 100

## 2017-08-18 MED ORDER — FENTANYL CITRATE (PF) 100 MCG/2ML IJ SOLN
INTRAMUSCULAR | Status: AC
  Filled 2017-08-18: qty 2

## 2017-08-18 MED ORDER — PROMETHAZINE HCL 25 MG/ML IJ SOLN: 6.2500 mg | INTRAMUSCULAR | Status: DC | PRN

## 2017-08-18 MED ORDER — TETANUS-DIPHTH-ACELL PERTUSSIS 5-2.5-18.5 LF-MCG/0.5 IM SUSP: 0.5000 mL | Freq: Once | INTRAMUSCULAR | Status: DC

## 2017-08-18 SURGICAL SUPPLY — 33 items
CLAMP CORD UMBIL (MISCELLANEOUS) IMPLANT
CLIP FILSHIE TUBAL LIGA STRL (Clip) ×3 IMPLANT
CLOSURE WOUND 1/2 X4 (GAUZE/BANDAGES/DRESSINGS) ×1
CLOTH BEACON ORANGE TIMEOUT ST (SAFETY) ×3 IMPLANT
DERMABOND ADVANCED (GAUZE/BANDAGES/DRESSINGS) ×2
DERMABOND ADVANCED .7 DNX12 (GAUZE/BANDAGES/DRESSINGS) ×1 IMPLANT
DRSG OPSITE POSTOP 4X10 (GAUZE/BANDAGES/DRESSINGS) ×3 IMPLANT
DURAPREP 26ML APPLICATOR (WOUND CARE) ×3 IMPLANT
ELECT REM PT RETURN 9FT ADLT (ELECTROSURGICAL) ×3
ELECTRODE REM PT RTRN 9FT ADLT (ELECTROSURGICAL) ×1 IMPLANT
EXTRACTOR VACUUM M CUP 4 TUBE (SUCTIONS) IMPLANT
EXTRACTOR VACUUM M CUP 4' TUBE (SUCTIONS)
GLOVE BIOGEL PI IND STRL 7.0 (GLOVE) ×1 IMPLANT
GLOVE BIOGEL PI INDICATOR 7.0 (GLOVE) ×2
GLOVE SURG ORTHO 8.0 STRL STRW (GLOVE) ×3 IMPLANT
GOWN STRL REUS W/TWL LRG LVL3 (GOWN DISPOSABLE) ×6 IMPLANT
KIT ABG SYR 3ML LUER SLIP (SYRINGE) ×3 IMPLANT
NEEDLE HYPO 25X5/8 SAFETYGLIDE (NEEDLE) ×3 IMPLANT
NS IRRIG 1000ML POUR BTL (IV SOLUTION) ×3 IMPLANT
PACK C SECTION WH (CUSTOM PROCEDURE TRAY) ×3 IMPLANT
PAD OB MATERNITY 4.3X12.25 (PERSONAL CARE ITEMS) ×3 IMPLANT
PENCIL SMOKE EVAC W/HOLSTER (ELECTROSURGICAL) ×3 IMPLANT
RTRCTR C-SECT PINK 25CM LRG (MISCELLANEOUS) ×3 IMPLANT
STRIP CLOSURE SKIN 1/2X4 (GAUZE/BANDAGES/DRESSINGS) ×2 IMPLANT
SUT MNCRL 0 VIOLET CTX 36 (SUTURE) ×3 IMPLANT
SUT MON AB 4-0 PS1 27 (SUTURE) ×3 IMPLANT
SUT MONOCRYL 0 CTX 36 (SUTURE) ×6
SUT PDS AB 1 CT  36 (SUTURE)
SUT PDS AB 1 CT 36 (SUTURE) IMPLANT
SUT VIC AB 1 CTX 36 (SUTURE)
SUT VIC AB 1 CTX36XBRD ANBCTRL (SUTURE) IMPLANT
TOWEL OR 17X24 6PK STRL BLUE (TOWEL DISPOSABLE) ×3 IMPLANT
TRAY FOLEY BAG SILVER LF 14FR (SET/KITS/TRAYS/PACK) ×3 IMPLANT

## 2017-08-18 NOTE — Anesthesia Postprocedure Evaluation (Signed)
Anesthesia Post Note  Patient: Kari Pearson  Procedure(s) Performed: CESAREAN SECTION WITH BILATERAL TUBAL LIGATION (N/A )     Patient location during evaluation: Mother Baby Anesthesia Type: Spinal Level of consciousness: awake, awake and alert, oriented and patient cooperative Pain management: pain level controlled Vital Signs Assessment: post-procedure vital signs reviewed and stable Respiratory status: spontaneous breathing, respiratory function stable and nonlabored ventilation Cardiovascular status: stable Postop Assessment: no headache, no backache, patient able to bend at knees and no apparent nausea or vomiting Anesthetic complications: no    Last Vitals:  Vitals:   08/18/17 1131 08/18/17 1200  BP: (!) 106/46   Pulse: 71   Resp: 20   Temp: 36.7 C   SpO2: 98% 97%    Last Pain:  Vitals:   08/18/17 1131  TempSrc: Oral  PainSc: 5    Pain Goal: Patients Stated Pain Goal: 3 (08/18/17 1131)               Jacqeline Broers L

## 2017-08-18 NOTE — Anesthesia Preprocedure Evaluation (Signed)
Anesthesia Evaluation  Patient identified by MRN, date of birth, ID band Patient awake    Reviewed: Allergy & Precautions, NPO status , Patient's Chart, lab work & pertinent test results  Airway Mallampati: II  TM Distance: >3 FB Neck ROM: Full    Dental no notable dental hx.    Pulmonary neg pulmonary ROS, asthma , Current Smoker,    Pulmonary exam normal breath sounds clear to auscultation       Cardiovascular negative cardio ROS Normal cardiovascular exam Rhythm:Regular Rate:Normal     Neuro/Psych Depression negative neurological ROS  negative psych ROS   GI/Hepatic negative GI ROS, Neg liver ROS,   Endo/Other  negative endocrine ROSdiabetes, Gestational  Renal/GU negative Renal ROS  negative genitourinary   Musculoskeletal negative musculoskeletal ROS (+)   Abdominal   Peds negative pediatric ROS (+)  Hematology negative hematology ROS (+)   Anesthesia Other Findings   Reproductive/Obstetrics negative OB ROS (+) Pregnancy                             Anesthesia Physical Anesthesia Plan  ASA: II  Anesthesia Plan: Spinal   Post-op Pain Management:    Induction: Intravenous  PONV Risk Score and Plan: 1 and Treatment may vary due to age or medical condition and Ondansetron  Airway Management Planned: Natural Airway  Additional Equipment:   Intra-op Plan:   Post-operative Plan:   Informed Consent: I have reviewed the patients History and Physical, chart, labs and discussed the procedure including the risks, benefits and alternatives for the proposed anesthesia with the patient or authorized representative who has indicated his/her understanding and acceptance.   Dental advisory given  Plan Discussed with: CRNA  Anesthesia Plan Comments:         Anesthesia Quick Evaluation

## 2017-08-18 NOTE — Addendum Note (Signed)
Addendum  created 08/18/17 1313 by Yolonda Kidaarver, Dorleen Kissel L, CRNA   Sign clinical note

## 2017-08-18 NOTE — Transfer of Care (Signed)
Immediate Anesthesia Transfer of Care Note  Patient: Kari DrownSarah D Tendler  Procedure(s) Performed: CESAREAN SECTION WITH BILATERAL TUBAL LIGATION (N/A )  Patient Location: PACU  Anesthesia Type:Spinal  Level of Consciousness: awake, alert  and oriented  Airway & Oxygen Therapy: Patient Spontanous Breathing  Post-op Assessment: Report given to RN and Post -op Vital signs reviewed and stable  Post vital signs: Reviewed and stable  Last Vitals:  Vitals:   08/18/17 0857 08/18/17 0900  BP: (!) 85/61   Pulse:  (!) 112  Resp:  13  Temp:    SpO2:  99%    Last Pain:  Vitals:   08/18/17 0608  TempSrc: Oral      Patients Stated Pain Goal: 2 (08/18/17 [id number])  Complications: No apparent anesthesia complications

## 2017-08-18 NOTE — Anesthesia Postprocedure Evaluation (Signed)
Anesthesia Post Note  Patient: Kari Pearson  Procedure(s) Performed: CESAREAN SECTION WITH BILATERAL TUBAL LIGATION (N/A )     Patient location during evaluation: PACU Anesthesia Type: Spinal Level of consciousness: oriented and awake and alert Pain management: pain level controlled Vital Signs Assessment: post-procedure vital signs reviewed and stable Respiratory status: spontaneous breathing and respiratory function stable Cardiovascular status: blood pressure returned to baseline and stable Postop Assessment: no headache, no backache and no apparent nausea or vomiting Anesthetic complications: no    Last Vitals:  Vitals:   08/18/17 1015 08/18/17 1030  BP: 99/67 112/71  Pulse: 77 65  Resp: 15 17  Temp: 36.6 C   SpO2:  98%    Last Pain:  Vitals:   08/18/17 1030  TempSrc:   PainSc: 2    Pain Goal: Patients Stated Pain Goal: 2 (08/18/17 0608)               Lowella CurbWarren Ray Lawernce Earll

## 2017-08-18 NOTE — Progress Notes (Signed)
Called Dr. Hyacinth MeekerMIller d/t pt c/o pain 5/10 unrelieved by toradol at 1006 and tylenol at 1131. Per Dr. Hyacinth MeekerMiller, pt may have narcotic pain medication that is ordered.

## 2017-08-18 NOTE — Op Note (Signed)
Cesarean Section Procedure Note  Pre-operative Diagnosis: prev C/S for repeat, desires sterilization  Post-operative Diagnosis: same  Surgeon: Elna Radovich Turner Daniels   Assistants: Herbert SetaHeather, RNFA  Anesthesia: spinal  Procedure:  Low Segment Transverse cesarean section  Procedure Details  The patient was seen in the Holding Room. The risks, benefits, complications, treatment options, and expected outcomes were discussed with the patient.  The patient concurred with the proposed plan, giving informed consent.  The site of surgery properly noted/marked.. A Time Out was held and the above information confirmed.  After induction of anesthesia, the patient was draped and prepped in the usual sterile manner. A Pfannenstiel incision was made and carried down through the subcutaneous tissue to the fascia. Fascial incision was made and extended transversely. The fascia was separated from the underlying rectus tissue superiorly and inferiorly. The peritoneum was identified and entered. Peritoneal incision was extended longitudinally. The utero-vesical peritoneal reflection was incised transversely and the bladder flap was bluntly freed from the lower uterine segment. A low transverse uterine incision was made. Delivered from vertex presentation was a baby with Apgar scores of 6 at one minute and 7 at five minutes. After the umbilical cord was clamped and cut cord blood was obtained for evaluation. The placenta was removed intact and appeared normal. The uterine outline, tubes and ovaries appeared normal. The uterine incision was closed with running locked sutures of 0 monocryl and imbricated with 0 monocryl. Hemostasis was observed.The fallopian tubes were identified by their fimbriated ends and filshie clips were placed across the tubes at the midportion of each tube.  Good placement was noted bilaterally.  Lavage was carried out until clear. The peritoneum was then closed with 0 monocryl and rectus muscles plicated in  the midline.  After hemostasis was assured, the fascia was then reapproximated with running sutures of 0 PDS. Irrigation was applied and after adequate hemostasis was assured, the skin was reapproximated with subcutaneous sutures using 4-0 monocryl.  Instrument, sponge, and needle counts were correct prior the abdominal closure and at the conclusion of the case. The patient received 2 grams cefotetan preoperatively.  Findings: Viable female  Estimated Blood Loss:  600cc         Specimens: Placenta was sent to labor and delivery         Complications:  None

## 2017-08-18 NOTE — Anesthesia Procedure Notes (Signed)
Spinal  Patient location during procedure: OB Start time: 08/18/2017 7:42 AM End time: 08/18/2017 7:47 AM Staffing Anesthesiologist: Lowella CurbMiller, Warren Ray, MD Performed: anesthesiologist  Preanesthetic Checklist Completed: patient identified, surgical consent, pre-op evaluation, timeout performed, IV checked, risks and benefits discussed and monitors and equipment checked Spinal Block Patient position: sitting Prep: site prepped and draped and DuraPrep Patient monitoring: heart rate, cardiac monitor, continuous pulse ox and blood pressure Approach: midline Location: L3-4 Injection technique: single-shot Needle Needle type: Pencan  Needle gauge: 24 G Needle length: 10 cm Assessment Sensory level: T4

## 2017-08-19 LAB — CBC
HCT: 30.9 % — ABNORMAL LOW (ref 36.0–46.0)
Hemoglobin: 10.3 g/dL — ABNORMAL LOW (ref 12.0–15.0)
MCH: 28.9 pg (ref 26.0–34.0)
MCHC: 33.3 g/dL (ref 30.0–36.0)
MCV: 86.8 fL (ref 78.0–100.0)
PLATELETS: 377 10*3/uL (ref 150–400)
RBC: 3.56 MIL/uL — AB (ref 3.87–5.11)
RDW: 14.4 % (ref 11.5–15.5)
WBC: 19 10*3/uL — ABNORMAL HIGH (ref 4.0–10.5)

## 2017-08-19 NOTE — Progress Notes (Signed)
Subjective: Postpartum Day 1: Cesarean Delivery Patient reports incisional pain, tolerating PO, + flatus and no problems voiding.    Objective: Vital signs in last 24 hours: Temp:  [98 F (36.7 C)-98.8 F (37.1 C)] 98.6 F (37 C) (12/25 0452) Pulse Rate:  [61-90] 85 (12/25 0452) Resp:  [16-24] 18 (12/25 0452) BP: (102-110)/(40-73) 102/62 (12/25 0452) SpO2:  [96 %-99 %] 97 % (12/25 0258)  Physical Exam:  General: alert, cooperative, appears stated age and no distress Lochia: appropriate Uterine Fundus: firm Incision: healing well DVT Evaluation: No evidence of DVT seen on physical exam.  Recent Labs    08/19/17 0510  HGB 10.3*  HCT 30.9*    Assessment/Plan: Status post Cesarean section. Doing well postoperatively.  Continue current care.  Kari Pearson C 08/19/2017, 10:33 AM

## 2017-08-19 NOTE — Lactation Note (Signed)
This note was copied from a baby's chart. Lactation Consultation Note  Patient Name: Kari Pearson ZOXWR'UToday's Date: 08/19/2017 Reason for consult: Follow-up assessment   P3, Attempted breastfeeding in football position. Baby mouthed nipple but did not sustain latch. FOB had recently give baby approx 15 ml of formula after 20 min of breastfeeding. Baby became spitty through nose during attempt. Used bulb syringe. Recommend holding baby upright 15 min after feeding. Mother requesting new tubing for personal DEBP which was provided. Encouraged breastfeeding often and before formula to help establish milk supply.        Maternal Data    Feeding Feeding Type: Breast Fed Length of feed: 20 min  LATCH Score                   Interventions    Lactation Tools Discussed/Used     Consult Status Consult Status: Follow-up Date: 08/20/17 Follow-up type: In-patient    Dahlia ByesBerkelhammer, Ervin Rothbauer [facility] 08/19/2017, 5:35 PM

## 2017-08-19 NOTE — Progress Notes (Signed)
MOB was referred for history of depression/anxiety. * Referral screened out by Clinical Social Worker because none of the following criteria appear to apply: ~ History of anxiety/depression during this pregnancy, or of post-partum depression. ~ Diagnosis of anxiety and/or depression within last 3 years;  No concerns noted in OB records. OR * MOB's symptoms currently being treated with medication and/or therapy.  Please contact the Clinical Social Worker if needs arise, by MOB request, or if MOB scores greater than 9/yes to question 10 on Edinburgh Postpartum Depression Screen.  Lennard Capek Boyd-Gilyard, MSW, LCSW Clinical Social Work [phone]   

## 2017-08-19 NOTE — Lactation Note (Signed)
This note was copied from a baby's chart. Lactation Consultation Note Baby 18 hrs old. Mom stated she is putting baby to the breast then supplementing w/formula. Mom has everted nipples, has small breast, wide space between breast. Hand expression demonstrated w/colostrum pouring out of breast easily. Praised mom, encouraged mom to BF before giving formula. Mom is breast/formula feeding. Stated that is how to build milk supply.  Encouraged STS, I&O. Reviewed newborn behavior, cluster feeding, supply and demand. Encouraged to BF before giving formula. Mom didn't BF long with her other two children. Mom encouraged to feed baby 8-12 times/24 hours and with feeding cues.  [facility] brochure given w/resources, support groups and LC services. Patient Name: Kari Pearson'UToday's Date: 08/19/2017 Reason for consult: Initial assessment   Maternal Data Has patient been taught Hand Expression?: Yes Does the patient have breastfeeding experience prior to this delivery?: Yes  Feeding Feeding Type: Breast Fed Length of feed: 25 min  LATCH Score Latch: Grasps breast easily, tongue down, lips flanged, rhythmical sucking.  Audible Swallowing: A few with stimulation  Type of Nipple: Everted at rest and after stimulation  Comfort (Breast/Nipple): Soft / non-tender  Hold (Positioning): Assistance needed to correctly position infant at breast and maintain latch.  LATCH Score: 8  Interventions Interventions: Breast feeding basics reviewed;Breast compression;Support pillows;DEBP;Breast massage  Lactation Tools Discussed/Used Tools: Pump WIC Program: Yes Pump Review: Setup, frequency, and cleaning;Milk Storage Initiated by:: RN Date initiated:: 08/18/17   Consult Status Consult Status: Follow-up Date: 08/20/17 Follow-up type: In-patient    Charyl DancerCARVER, Maleiyah Releford G 08/19/2017, 2:49 AM

## 2017-08-20 MED ORDER — IBUPROFEN 600 MG PO TABS
600.0000 mg | ORAL_TABLET | Freq: Four times a day (QID) | ORAL | 0 refills | Status: AC
Start: 2017-08-20 — End: ?

## 2017-08-20 MED ORDER — OXYCODONE-ACETAMINOPHEN 5-325 MG PO TABS: 1.0000 | ORAL_TABLET | ORAL | 0 refills | Status: AC | PRN

## 2017-08-20 NOTE — Discharge Summary (Signed)
Obstetric Discharge Summary Reason for Admission: cesarean section Prenatal Procedures: none Intrapartum Procedures: cesarean: low cervical, transverse and tubal ligation Postpartum Procedures: none Complications-Operative and Postpartum: none Hemoglobin  Date Value Ref Range Status  08/19/2017 10.3 (L) 12.0 - 15.0 g/dL Final   HCT  Date Value Ref Range Status  08/19/2017 30.9 (L) 36.0 - 46.0 % Final    Physical Exam:  General: alert and cooperative Lochia: appropriate Uterine Fundus: firm Incision: healing well, no significant drainage DVT Evaluation: No evidence of DVT seen on physical exam.  Discharge Diagnoses: Term Pregnancy-delivered  Discharge Information: Date: 08/20/2017 Activity: pelvic rest Diet: routine Medications: PNV, Ibuprofen and Percocet Condition: stable Instructions: refer to practice specific booklet Discharge to: home Follow-up Information    [place] Schedule an appointment as soon as possible for a visit in 2 week(s).   Contact information: [address] [place] [id number] [phone]           Newborn Data: Live born female  Birth Weight: 6 lb 14.8 oz (3140 g) APGAR: 7, 8  Newborn Delivery   Birth date/time:  08/18/2017 08:08:00 Delivery type:       Home with mother.  Kari Pearson 08/20/2017, 8:42 AM

## 2017-08-21 ENCOUNTER — Encounter (HOSPITAL_COMMUNITY): Payer: Self-pay | Admitting: *Deleted

## 2019-03-28 IMAGING — US US MFM OB COMP +14 WKS
1 series · 14 of 28 positions shown · non-contrast
Comparison: none

[Series 1: us mfm ob comp +14 wks · 14 of 74 slices shown]
[im 3/74]
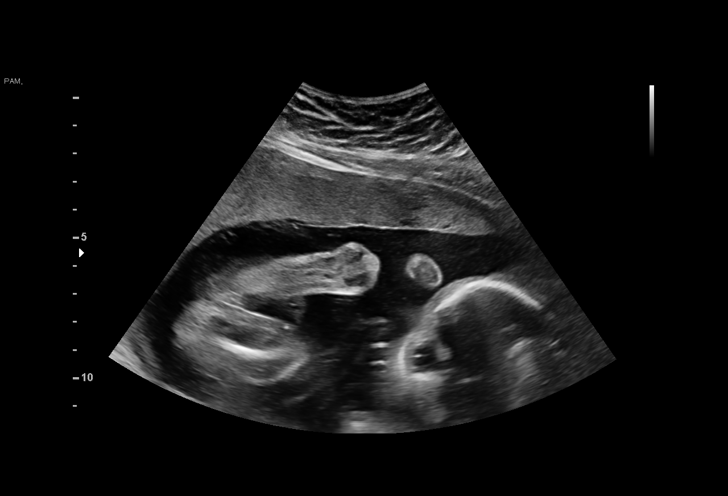
[im 9/74]
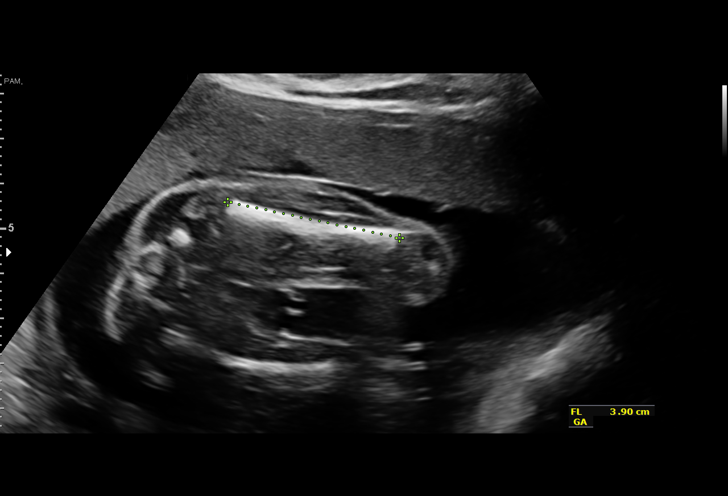
[im 14/74]
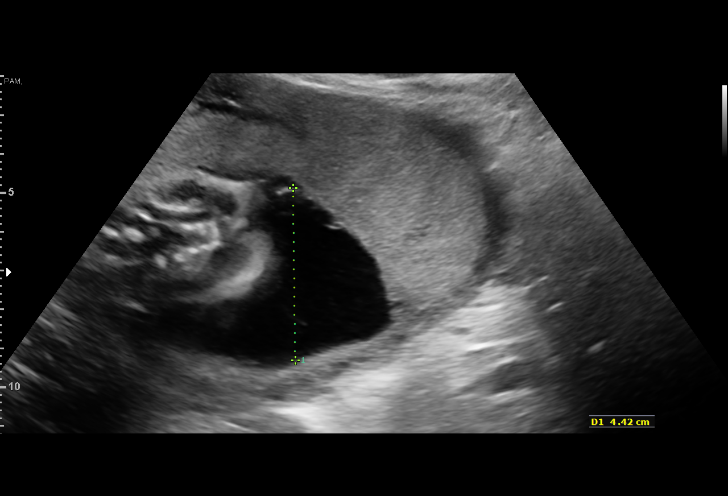
[im 19/74]
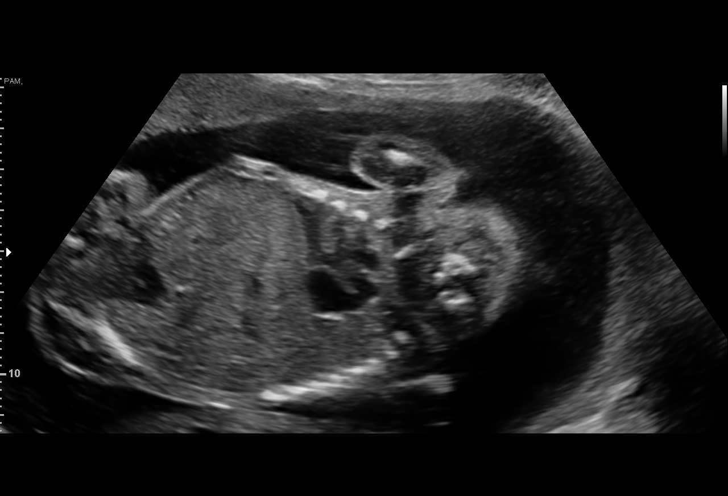
[im 25/74]
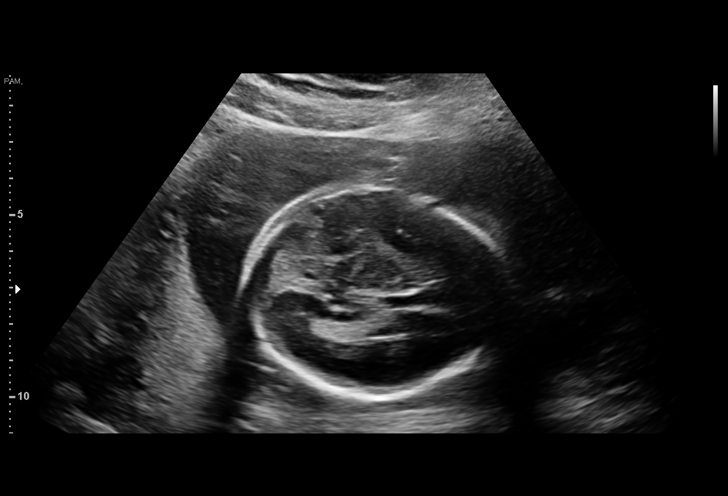
[im 30/74]
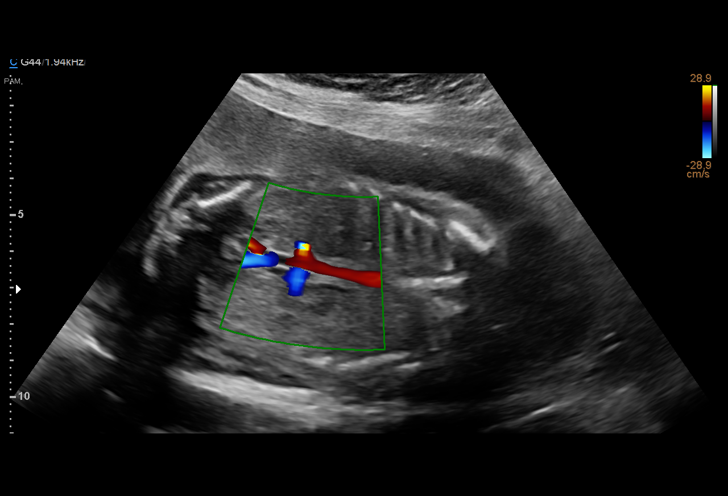
[im 36/74]
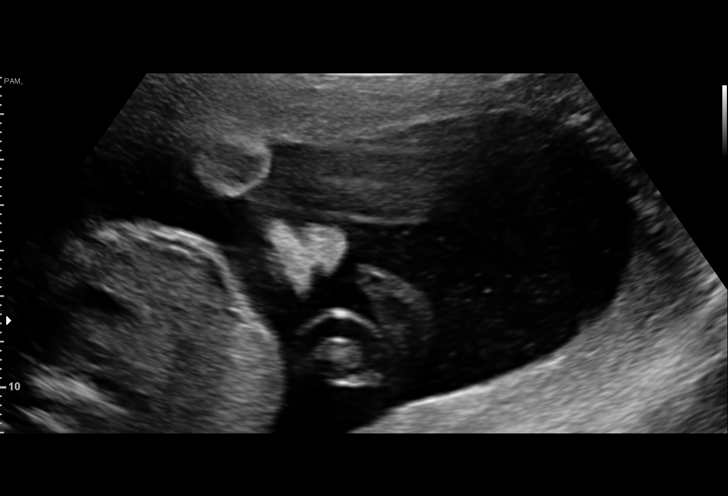
[im 41/74]
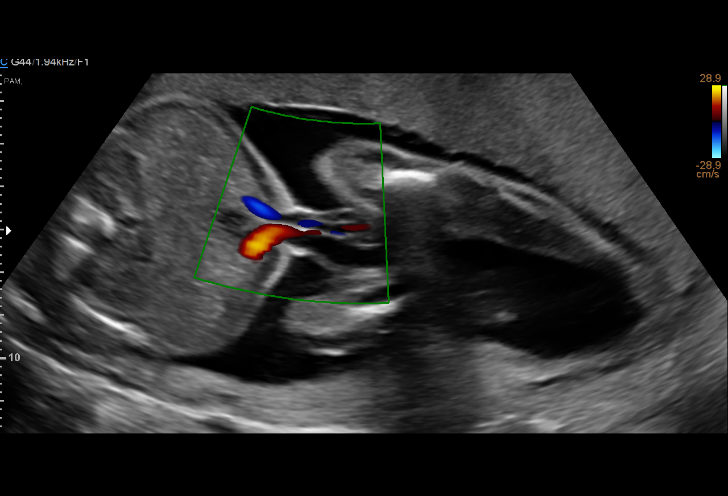
[im 46/74]
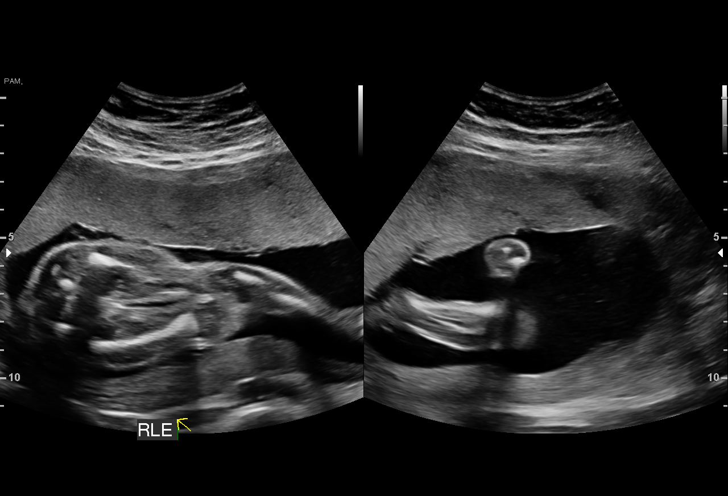
[im 52/74]
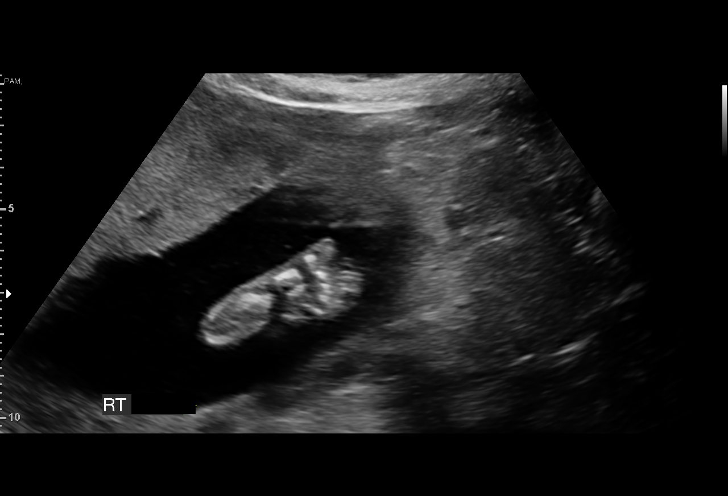
[im 57/74]
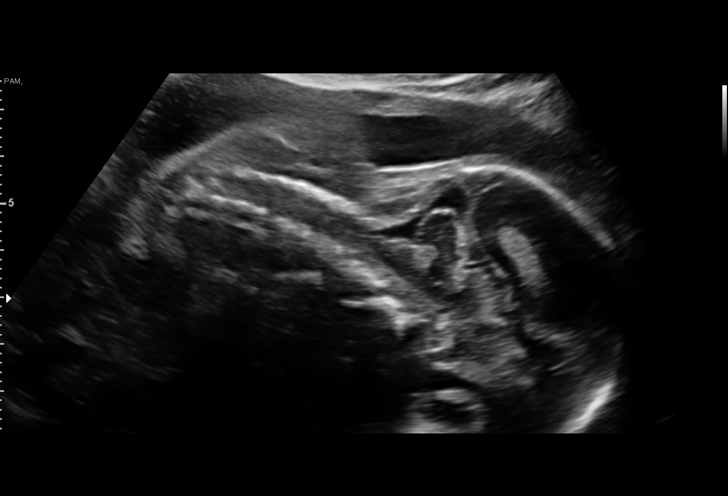
[im 63/74]
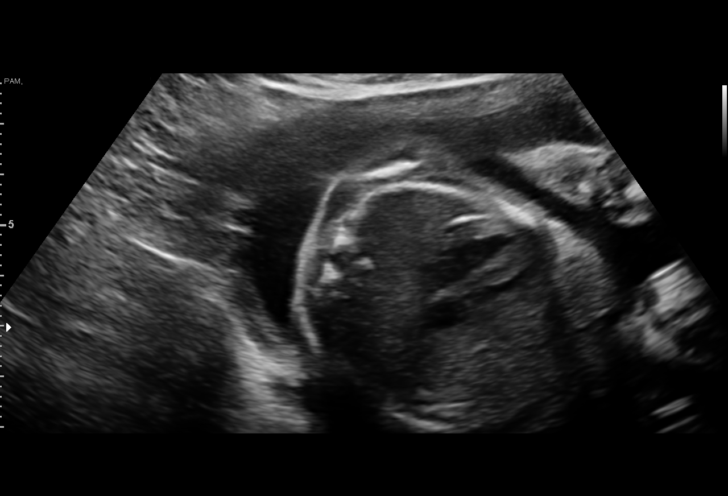
[im 68/74]
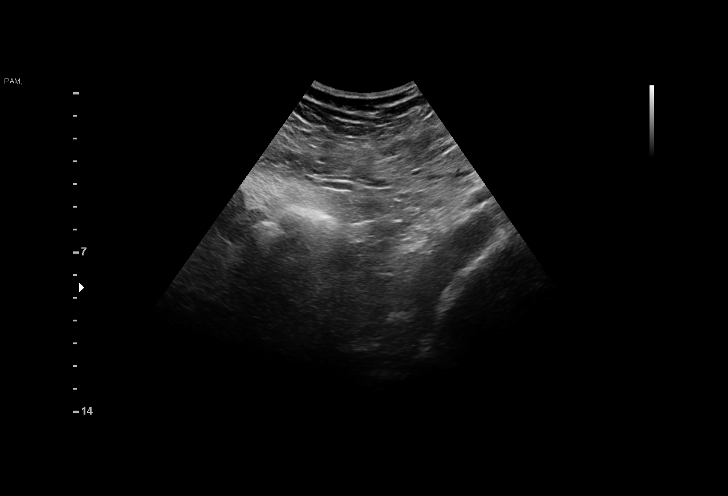
[im 74/74]
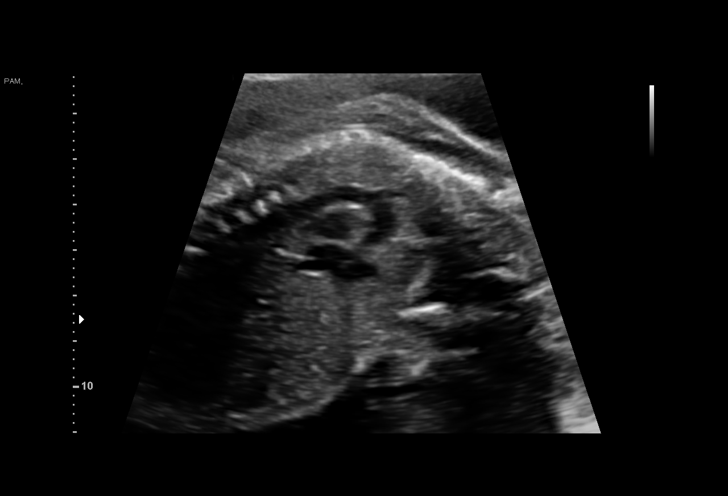

[14 of 28 positions shown; findings below may reference images not displayed]

Performed By:     Mert Markus        Secondary Phy.:   BRUNA
MAU/Triage

1  GRYSUNYA KIRTOAKEH           [id number]       [id number]     [id number]
Indications

23 weeks gestation of pregnancy
Abdominal pain in pregnancy
OB History

Gravidity:    3         Term:   2
Living:       2
Fetal Evaluation

Num Of Fetuses:     1
Fetal Heart         146
Rate(bpm):
Cardiac Activity:   Observed
Presentation:       Cephalic
Placenta:           Anterior, above cervical os
P. Cord Insertion:  Visualized, central

Amniotic Fluid
AFI FV:      Subjectively within normal limits

Largest Pocket(cm)
4.4
Biometry

BPD:      55.7  mm     G. Age:  23w 0d         39  %    CI:        70.44   %    70 - 86
FL/HC:      18.4   %    19.2 -
HC:      211.6  mm     G. Age:  23w 2d         39  %    HC/AC:      1.12        1.05 -
AC:      188.1  mm     G. Age:  23w 4d         55  %    FL/BPD:     70.0   %    71 - 87
FL:         39  mm     G. Age:  22w 4d         21  %    FL/AC:      20.7   %    20 - 24
CER:      26.2  mm     G. Age:  24w 1d         65  %

Est. FW:     565  gm      1 lb 4 oz     53  %
Gestational Age

Clinical EDD:  23w 1d                                        EDD:   08/24/17
U/S Today:     23w 1d                                        EDD:   08/24/17
Best:          23w 1d     Det. By:  Clinical EDD             EDD:   08/24/17
Anatomy

Cranium:               Appears normal         LVOT:                   Appears normal
Cavum:                 Appears normal         Aortic Arch:            Appears normal
Ventricles:            Appears normal         Ductal Arch:            Not well visualized
Choroid Plexus:        Appears normal         Diaphragm:              Appears normal
Cerebellum:            Appears normal         Stomach:                Appears normal, left
sided
Posterior Fossa:       Appears normal         Abdomen:                Appears normal
Nuchal Fold:           Not applicable (>20    Abdominal Wall:         Appears nml (cord
wks GA)                                        insert, abd wall)
Face:                  Appears normal         Cord Vessels:           Appears normal (3
(orbits and profile)                           vessel cord)
Lips:                  Appears normal         Kidneys:                Appear normal
Palate:                Not well visualized    Bladder:                Appears normal
Thoracic:              Appears normal         Spine:                  Appears normal
Heart:                 Appears normal         Upper Extremities:      Appears normal
(4CH, axis, and
situs)
RVOT:                  Not well visualized    Lower Extremities:      Appears normal

Other:  Fetus appears to be a female. Heels and 5th digit visualized. Nasal
bone visualized.
Cervix Uterus Adnexa

Cervix
Length:           4.95  cm.
Normal appearance by transabdominal scan.
Impression

Singleton intrauterine pregnancy at 23+1 weeks with
abdominal pain
Review of the anatomy shows no sonographic markers for
aneuploidy or structural anomalies
However, cardiac evaluation should be considered suboptimal
Placentation is normal without sonographic markers for
abruption
Amniotic fluid volume is normal
Estimated fetal weight is 565g which is growth in the 53rd
percentile
Cervix appears normal transabdominally
Recommendations

No sonographic explanation for clinical presentation
Continue clinical evaluation and management

## 2019-07-29 ENCOUNTER — Other Ambulatory Visit: Payer: Self-pay

## 2019-07-29 DIAGNOSIS — Z20822 Contact with and (suspected) exposure to covid-19: Secondary | ICD-10-CM

## 2019-08-01 LAB — NOVEL CORONAVIRUS, NAA: SARS-CoV-2, NAA: NOT DETECTED

## 2019-11-23 ENCOUNTER — Ambulatory Visit: Payer: Medicaid Other | Attending: Internal Medicine

## 2019-11-23 ENCOUNTER — Other Ambulatory Visit: Payer: Self-pay

## 2019-11-23 DIAGNOSIS — Z20822 Contact with and (suspected) exposure to covid-19: Secondary | ICD-10-CM

## 2019-11-24 LAB — NOVEL CORONAVIRUS, NAA: SARS-CoV-2, NAA: NOT DETECTED

## 2019-11-24 LAB — SARS-COV-2, NAA 2 DAY TAT
